# Patient Record
Sex: Female | Born: 1955 | Race: White | Hispanic: No | Marital: Married
Health system: Southern US, Community
[De-identification: ages and names within clinical notes are randomized; demographics above are authoritative.]

## PROBLEM LIST (undated history)

## (undated) DIAGNOSIS — F419 Anxiety disorder, unspecified: Secondary | ICD-10-CM

## (undated) DIAGNOSIS — K649 Unspecified hemorrhoids: Secondary | ICD-10-CM

## (undated) DIAGNOSIS — Z9851 Tubal ligation status: Secondary | ICD-10-CM

## (undated) DIAGNOSIS — F32A Depression, unspecified: Secondary | ICD-10-CM

---

## 1998-05-17 ENCOUNTER — Ambulatory Visit (HOSPITAL_COMMUNITY): Admission: RE | Admit: 1998-05-17 | Discharge: 1998-05-17 | Payer: Self-pay | Admitting: Obstetrics and Gynecology

## 2005-03-23 ENCOUNTER — Ambulatory Visit: Payer: Self-pay | Admitting: Internal Medicine

## 2005-03-23 ENCOUNTER — Ambulatory Visit: Payer: Self-pay | Admitting: Specialist

## 2005-09-10 ENCOUNTER — Ambulatory Visit: Payer: Self-pay | Admitting: Unknown Physician Specialty

## 2006-03-25 ENCOUNTER — Ambulatory Visit: Payer: Self-pay | Admitting: Internal Medicine

## 2007-12-04 ENCOUNTER — Ambulatory Visit: Payer: Self-pay | Admitting: Internal Medicine

## 2008-12-23 ENCOUNTER — Ambulatory Visit: Payer: Self-pay | Admitting: Internal Medicine

## 2010-08-02 ENCOUNTER — Emergency Department: Payer: Self-pay | Admitting: Emergency Medicine

## 2010-10-16 ENCOUNTER — Ambulatory Visit: Payer: Self-pay | Admitting: Internal Medicine

## 2010-10-17 ENCOUNTER — Ambulatory Visit: Payer: Self-pay | Admitting: Internal Medicine

## 2011-07-24 ENCOUNTER — Ambulatory Visit: Payer: Self-pay | Admitting: Internal Medicine

## 2011-07-25 ENCOUNTER — Ambulatory Visit: Payer: Self-pay | Admitting: Internal Medicine

## 2012-09-02 IMAGING — US ULTRASOUND RIGHT BREAST
1 series · 17 of 18 positions shown · non-contrast
Comparison: none

REASON FOR EXAM: AV RT NODULARITY
COMMENTS:

[Series 1: ultrasound right breast · 17 of 18 slices shown]
[im 1/18]
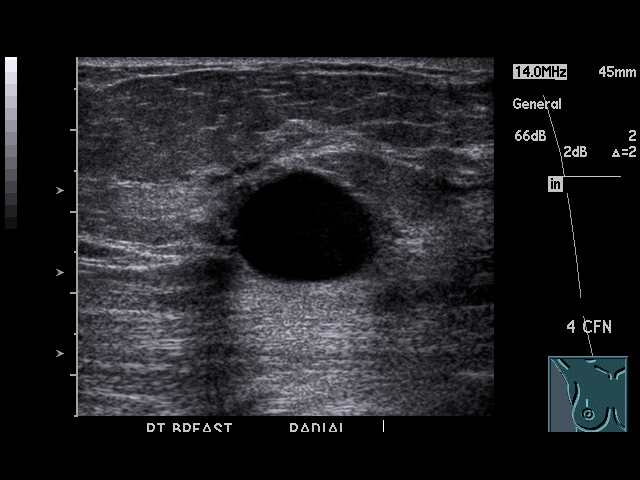
[im 2/18]
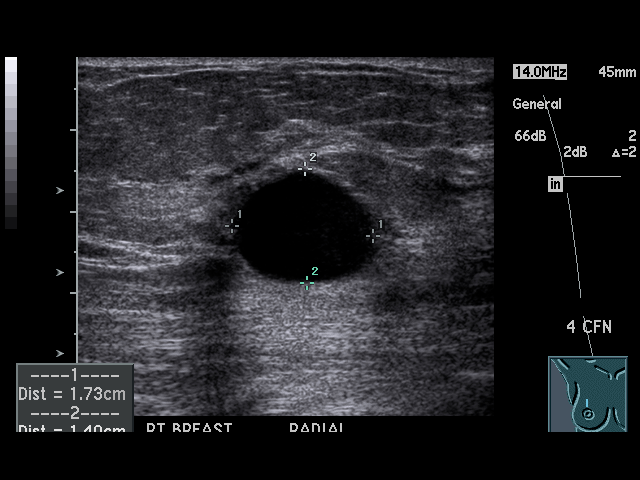
[im 3/18]
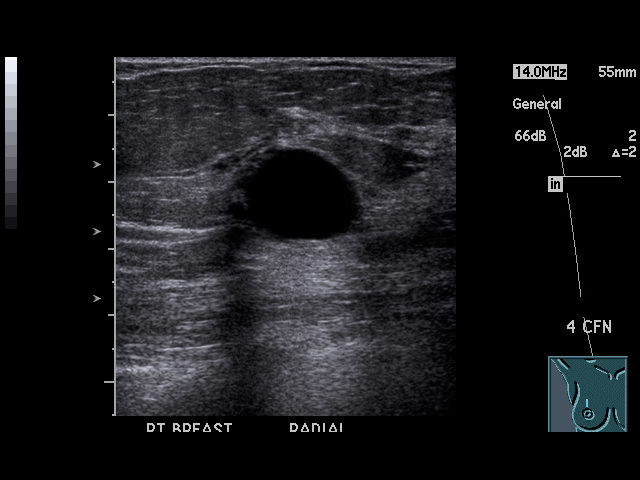
[im 4/18]
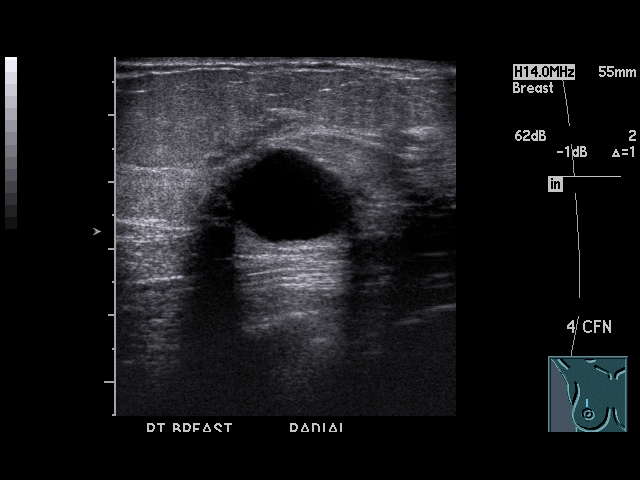
[im 5/18]
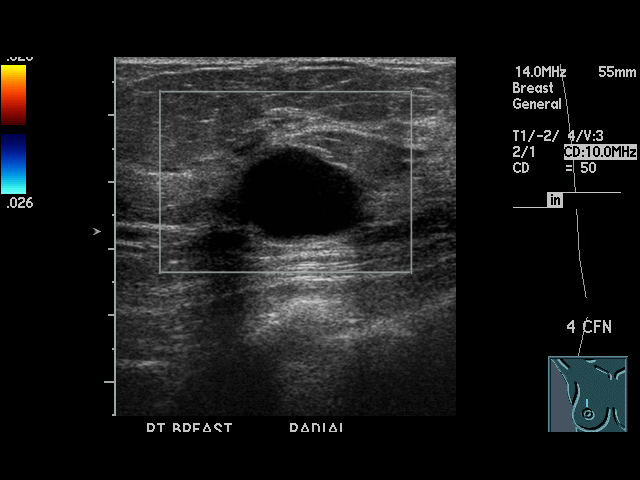
[im 6/18]
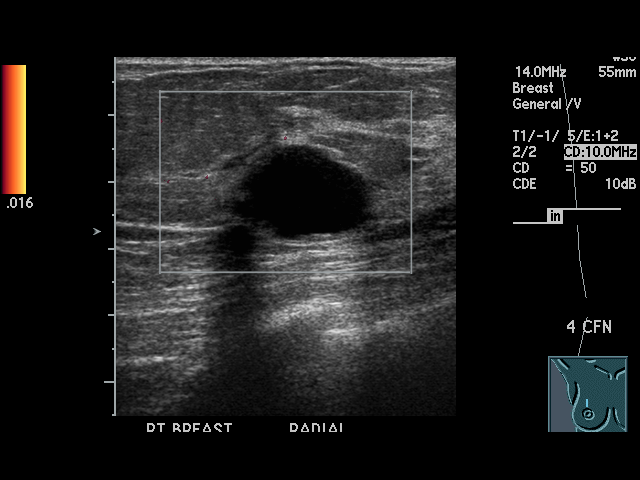
[im 7/18]
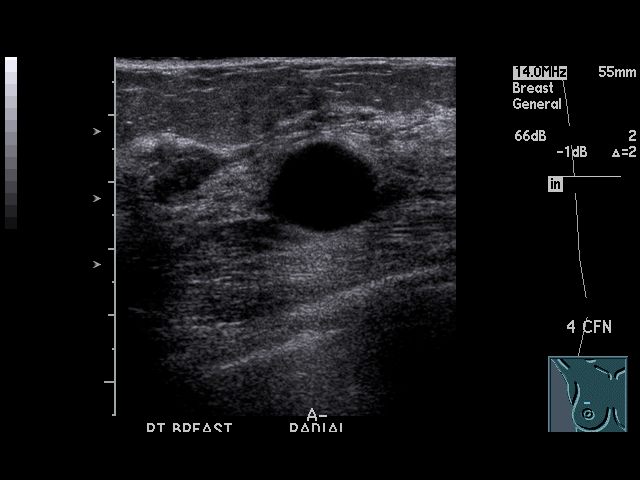
[im 8/18]
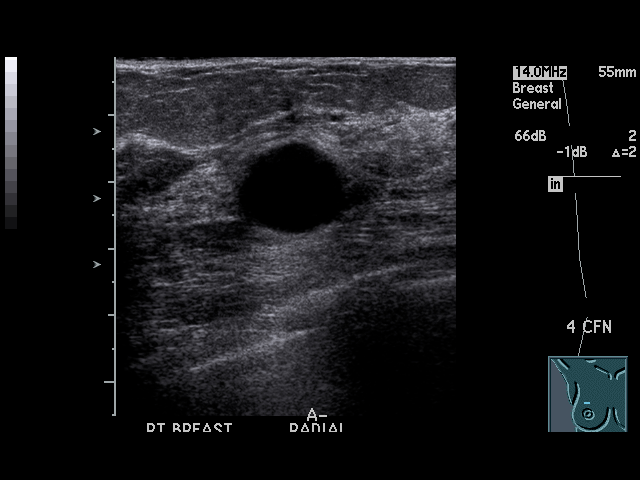
[im 10/18]
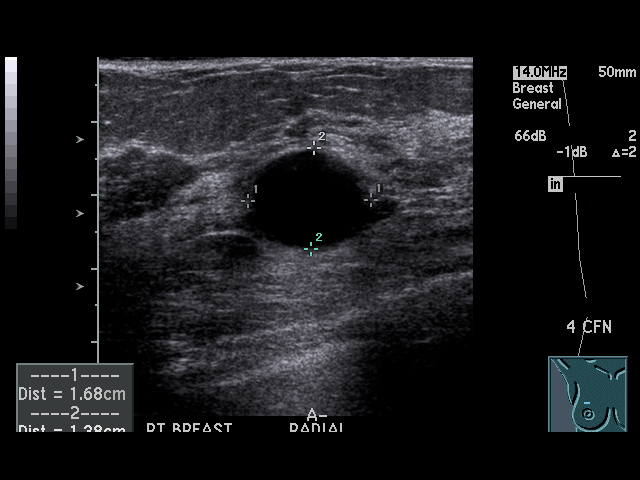
[im 11/18]
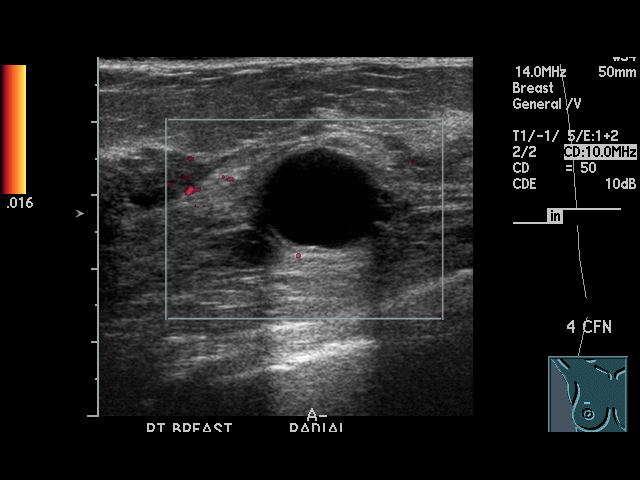
[im 12/18]
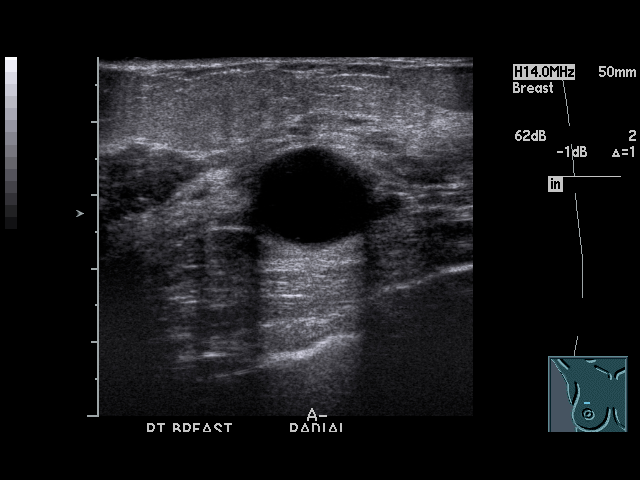
[im 13/18]
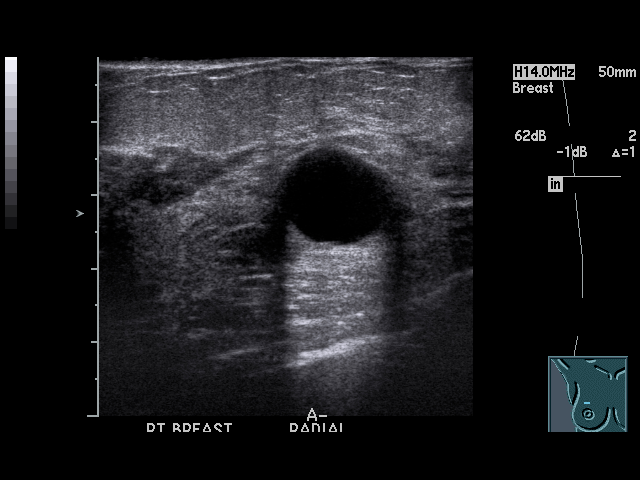
[im 14/18]
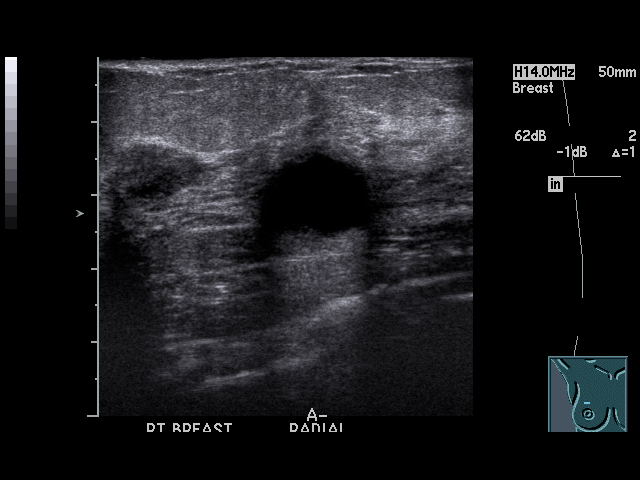
[im 15/18]
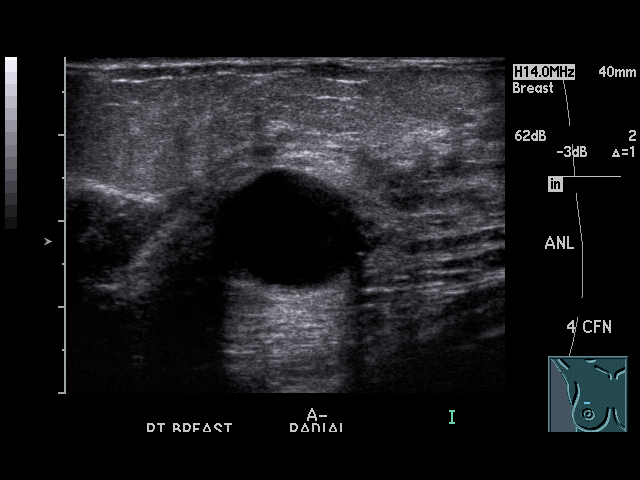
[im 16/18]
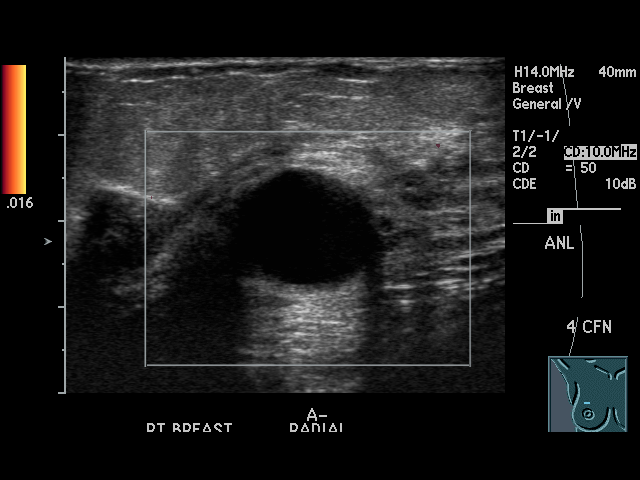
[im 17/18]
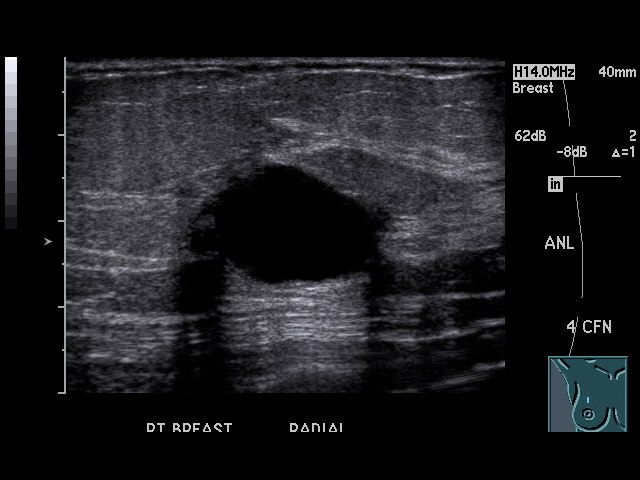
[im 18/18]
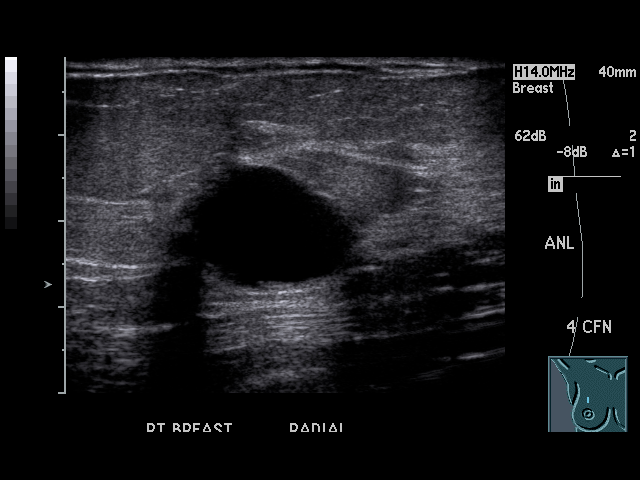

[17 of 18 positions shown; findings below may reference images not displayed]

PROCEDURE:     US  - US BREAST RIGHT  - July 25, 2011  [DATE]

RESULT:     The patient underwent directed ultrasound of the superior aspect
of the right breast in followup of a mammogram dated 24 July, 2011. On the
mammogram increased conspicuity of a smoothly rounded density was seen. On
today's ultrasound there is an anechoic structure measuring 1.7 x
centimeters at the [DATE] position demonstrating enhanced
through-transmission consistent with a cyst.
IMPRESSION: The dominant density seen mammographically is consistent
with a simple cyst.

BI-RADS 2

Recommendation: Please return to yearly mammographic followup of both
breasts.

A negative mammographic or ultrasonic report should not preclude biopsy of
clinically palpable or otherwise suspicious for lesions.

Dictation location: One

## 2012-11-20 ENCOUNTER — Ambulatory Visit: Payer: Self-pay | Admitting: Family Medicine

## 2014-11-10 ENCOUNTER — Other Ambulatory Visit: Payer: Self-pay | Admitting: Family Medicine

## 2014-11-10 DIAGNOSIS — Z1231 Encounter for screening mammogram for malignant neoplasm of breast: Secondary | ICD-10-CM

## 2014-11-11 ENCOUNTER — Encounter (INDEPENDENT_AMBULATORY_CARE_PROVIDER_SITE_OTHER): Payer: Self-pay

## 2014-11-11 ENCOUNTER — Ambulatory Visit: Payer: Self-pay

## 2014-11-11 ENCOUNTER — Ambulatory Visit
Admission: RE | Admit: 2014-11-11 | Discharge: 2014-11-11 | Disposition: A | Discharge status: Home / Self Care | Payer: BC Managed Care – PPO | Source: Ambulatory Visit | Attending: Family Medicine | Admitting: Family Medicine

## 2014-11-11 DIAGNOSIS — Z1231 Encounter for screening mammogram for malignant neoplasm of breast: Secondary | ICD-10-CM | POA: Diagnosis present

## 2016-03-15 ENCOUNTER — Ambulatory Visit (INDEPENDENT_AMBULATORY_CARE_PROVIDER_SITE_OTHER): Payer: Self-pay | Admitting: Orthopaedic Surgery

## 2016-06-25 ENCOUNTER — Other Ambulatory Visit: Payer: Self-pay | Admitting: Family Medicine

## 2016-09-10 ENCOUNTER — Other Ambulatory Visit: Payer: Self-pay | Admitting: Family Medicine

## 2016-09-10 DIAGNOSIS — Z1231 Encounter for screening mammogram for malignant neoplasm of breast: Secondary | ICD-10-CM

## 2016-09-11 ENCOUNTER — Inpatient Hospital Stay: Admission: RE | Admit: 2016-09-11 | Payer: BC Managed Care – PPO | Source: Ambulatory Visit

## 2021-03-04 LAB — COLOGUARD: COLOGUARD: NEGATIVE

## 2022-03-26 HISTORY — PX: JOINT REPLACEMENT: SHX530

## 2022-09-10 ENCOUNTER — Encounter: Payer: Self-pay | Admitting: Physical Therapy

## 2022-09-10 ENCOUNTER — Other Ambulatory Visit: Payer: Self-pay

## 2022-09-10 ENCOUNTER — Ambulatory Visit: Payer: Medicare PPO | Attending: Physician Assistant | Admitting: Physical Therapy

## 2022-09-10 DIAGNOSIS — Z96651 Presence of right artificial knee joint: Secondary | ICD-10-CM | POA: Diagnosis present

## 2022-09-10 DIAGNOSIS — M25561 Pain in right knee: Secondary | ICD-10-CM | POA: Insufficient documentation

## 2022-09-10 NOTE — Therapy (Signed)
OUTPATIENT PHYSICAL THERAPY LOWER EXTREMITY EVALUATION   Patient Name: Sally Cowan MRN: [id number] DOB:22-Oct-1955, 67 y.o., female Today's Date: 09/10/2022  END OF SESSION:  PT End of Session - 09/10/22 2131     Visit Number 1    Number of Visits 20    Date for PT Re-Evaluation 11/19/22    Authorization Type [employer] 2024    Authorization - Visit Number 1    Authorization - Number of Visits 20    Progress Note Due on Visit 10    PT Start Time 1600    PT Stop Time 1645    PT Time Calculation (min) 45 min    Activity Tolerance Patient tolerated treatment well    Behavior During Therapy [facility] for tasks assessed/performed             History reviewed. No pertinent past medical history. History reviewed. No pertinent surgical history. There are no problems to display for this patient.   PCP: No PCP   REFERRING PROVIDER: Addison Roe PA   REFERRING DIAG: Right TKA   THERAPY DIAG:  Acute pain of right knee  S/P TKR (total knee replacement), right  Rationale for Evaluation and Treatment: Rehabilitation  ONSET DATE: 09/06/22  SUBJECTIVE:   SUBJECTIVE STATEMENT: See pertinent history   PERTINENT HISTORY: Pt had right tka on June 13th and she reports everything has gone smoothly. She is currently living with her sister in law and she plans on living with her until she is able to drive again.  PAIN:  Are you having pain? Yes: NPRS scale: 6/10 Pain location: Anterior surface of left knee  Pain description: Achy Aggravating factors: Weight bearing and lifting right leg  Relieving factors: Meds   PRECAUTIONS: None  WEIGHT BEARING RESTRICTIONS: No  FALLS:  Has patient fallen in last 6 months? Yes. Number of falls Mechanical falls which she de describes as not picking up her feet   LIVING ENVIRONMENT: Lives with: lives with their family and Sister in Social worker  Lives in: House/apartment Stairs: Yes: External: 1 steps; none Has following equipment at home: Environmental consultant - 2  wheeled  OCCUPATION: She wants to return to gardening   PLOF: Independent  PATIENT GOALS: Return walking normally without pain   NEXT MD VISIT: 09/14/22  OBJECTIVE:   VITALS: BP 110/57 HR 64 Sp02 99  DIAGNOSTIC FINDINGS: None listed   PATIENT SURVEYS:  FOTO 30/100 with target of 55   COGNITION: Overall cognitive status: Within functional limits for tasks assessed     SENSATION: WFL  EDEMA:  Circumferential: R 18.5" L 16"  MUSCLE LENGTH: Not tested  Hamstrings: Right NA deg; Left NA deg Thomas test: Right  NA deg; Left NA deg  POSTURE: No Significant postural limitations  PALPATION: Not performed   LOWER EXTREMITY ROM:  Active/ Passive ROM Right eval Left eval  Hip flexion    Hip extension    Hip abduction    Hip adduction    Hip internal rotation    Hip external rotation    Knee flexion 60/65 135/135  Knee extension 3/5  0/0   Ankle dorsiflexion    Ankle plantarflexion    Ankle inversion    Ankle eversion     (Blank rows = not tested)        LOWER EXTREMITY MMT: Testing not performed   MMT Right eval Left eval  Hip flexion    Hip extension    Hip abduction    Hip adduction  Hip internal rotation    Hip external rotation    Knee flexion    Knee extension    Ankle dorsiflexion    Ankle plantarflexion    Ankle inversion    Ankle eversion     (Blank rows = not tested)   GAIT: Distance walked: 50 ft  Assistive device utilized: Walker - 2 wheeled Level of assistance: Modified independence Comments: Right sided antalgic gait    TODAY'S TREATMENT:                                                                                                                              DATE: All exercises performed on RLE   Quad Sets 3 sec hold 1 x 10    PATIENT EDUCATION:  Education details: form and technique to complete exercises correctly Person educated: Patient Education method: Programmer, multimedia, Demonstration, Verbal cues, and  Handouts Education comprehension: verbalized understanding, returned demonstration, and verbal cues required  HOME EXERCISE PROGRAM: Access Code: [email or web address] URL: [email or web address] Date: 09/10/2022 Prepared by: Ellin Goodie  Exercises - Supine Heel Slide  - 1 x daily - 3 sets - 10 reps - Supine Active Straight Leg Raise  - 1 x daily - 10 reps - Supine Quad Set  - 1 x daily - 3 sets - 10 reps - 3 sec  hold - Long Sitting Calf Stretch with Strap  - 1 x daily - 3 reps - 30-60 sec sec  hold  ASSESSMENT:  CLINICAL IMPRESSION: Patient is a 67 y.o. white who was seen today for physical therapy evaluation and treatment for right knee pain in setting of right TKA performed on June 13th. She is in phase II or the motion phase. She shows deficits that include decreased right knee ROM and strength and overall mobility and increased knee pain and swelling. She will benefit from skilled PT to address these aforementioned deficits to return to ambulating and bending and squatting so that she can resume gardening and walking to remain independent and active.   OBJECTIVE IMPAIRMENTS: Abnormal gait, decreased balance, decreased mobility, difficulty walking, decreased ROM, decreased strength, hypomobility, increased edema, impaired flexibility, and pain.   ACTIVITY LIMITATIONS: carrying, lifting, bending, sitting, standing, squatting, sleeping, stairs, bathing, toileting, and dressing  PARTICIPATION LIMITATIONS: cleaning, laundry, driving, shopping, community activity, yard work, and church  PERSONAL FACTORS: Age and Fitness are also affecting patient's functional outcome.   REHAB POTENTIAL: Good  CLINICAL DECISION MAKING: Stable/uncomplicated  EVALUATION COMPLEXITY: Low   GOALS: Goals reviewed with patient? No  SHORT TERM GOALS: Target date: 09/24/2022  Pt will be independent with HEP in order to improve strength and balance in order to decrease fall risk and improve function at  home and work. Baseline: NT  Goal status: INITIAL  2.  Patient will be able to ambulate without a 2WW as evidence of improve RLE function and mobility.  Baseline: 2WW Goal status: INITIAL   LONG TERM GOALS: Target date: 11/19/2022  Patient will have improved function and activity level as evidenced by an increase in FOTO score by 10 points or more.  Baseline: 30/100 with target of 55 Goal status: INITIAL  2.  Patient will show near symmetrical right knee AROM to left knee as evidence of improve right knee healing and function.  Baseline: Knee Flex R/L 60/135, Knee Ext R/L 3/0 Goal status: INITIAL  3.  Patient will show near symmetrical right strength as evidence of improved right knee healing and function.  Baseline: NT   Goal status: INITIAL  4.  Patient will be able to ambulate without an AD as evidence of improved right knee healing and function. Baseline: 2ww Goal status: INITIAL   PLAN:  PT FREQUENCY: 1-2x/week  PT DURATION: 10 weeks  PLANNED INTERVENTIONS: Therapeutic exercises, Neuromuscular re-education, Balance training, Gait training, Patient/Family education, Self Care, Joint mobilization, Joint manipulation, Stair training, DME instructions, Aquatic Therapy, Dry Needling, Electrical stimulation, Cryotherapy, Moist heat, Manual therapy, and Re-evaluation  PLAN FOR NEXT SESSION: Mini-squat, sit to stand, supine hip abduction, knee flexion AAROM in sitting.  Ellin Goodie PT, DPT  09/10/2022, 9:38 PM

## 2022-09-12 ENCOUNTER — Ambulatory Visit: Payer: Medicare PPO | Admitting: Physical Therapy

## 2022-09-12 DIAGNOSIS — M25561 Pain in right knee: Secondary | ICD-10-CM | POA: Diagnosis not present

## 2022-09-12 DIAGNOSIS — Z96651 Presence of right artificial knee joint: Secondary | ICD-10-CM

## 2022-09-12 NOTE — Therapy (Signed)
OUTPATIENT PHYSICAL THERAPY TREATMENT NOTE   Patient Name: Sally Cowan MRN: [id number] DOB:[birth year], 67 y.o., female Today's Date: 09/12/2022  PCP: Azell Der PA  REFERRING PROVIDER: Azell Der PA   END OF SESSION:   PT End of Session - 09/12/22 1304     Visit Number 2    Number of Visits 20    Date for PT Re-Evaluation 11/19/22    Authorization Type [employer] 2024    Authorization - Visit Number 2    Authorization - Number of Visits 20    Progress Note Due on Visit 10    PT Start Time 1305    PT Stop Time 1345    PT Time Calculation (min) 40 min    Activity Tolerance Patient tolerated treatment well    Behavior During Therapy [facility] for tasks assessed/performed             No past medical history on file. No past surgical history on file. There are no problems to display for this patient.   REFERRING DIAG: Right TKA   THERAPY DIAG:  No diagnosis found.  Rationale for Evaluation and Treatment Rehabilitation  PERTINENT HISTORY: Pt had right tka on June 13th and she reports everything has gone smoothly. She is currently living with her sister in law and she plans on living with her until she is able to drive again.   PRECAUTIONS: None  SUBJECTIVE:                                                                                                                                                                                      SUBJECTIVE STATEMENT:  Pt reports that she feels that her knee continues to feel better. She is    PAIN:  Are you having pain? Yes: NPRS scale: 3-4/10 Pain location: Right intra-articular knee  Pain description: Achy  Aggravating factors: Lifting leg up onto bed  Relieving factors: Keep knee still    OBJECTIVE: (objective measures completed at initial evaluation unless otherwise dated)  VITALS: BP 110/57 HR 64 Sp02 99   DIAGNOSTIC FINDINGS: None listed    PATIENT SURVEYS:  FOTO 30/100 with target of 55    COGNITION: Overall  cognitive status: Within functional limits for tasks assessed                         SENSATION: WFL   EDEMA:  Circumferential: R 18.5" L 16"   MUSCLE LENGTH: Not tested  Hamstrings: Right NA deg; Left NA deg Thomas test: Right  NA deg; Left NA deg   POSTURE: No Significant postural limitations   PALPATION: Not performed  LOWER EXTREMITY ROM:   Active/ Passive ROM Right eval Left eval  Hip flexion      Hip extension      Hip abduction      Hip adduction      Hip internal rotation      Hip external rotation      Knee flexion 60/65 135/135  Knee extension 3/5  0/0   Ankle dorsiflexion      Ankle plantarflexion      Ankle inversion      Ankle eversion       (Blank rows = not tested)              LOWER EXTREMITY MMT: Testing not performed    MMT Right eval Left eval  Hip flexion      Hip extension      Hip abduction      Hip adduction      Hip internal rotation      Hip external rotation      Knee flexion  4- 5   Knee extension 3-  5   Ankle dorsiflexion      Ankle plantarflexion      Ankle inversion      Ankle eversion       (Blank rows = not tested)     GAIT: Distance walked: 50 ft  Assistive device utilized: Walker - 2 wheeled Level of assistance: Modified independence Comments: Right sided antalgic gait      TODAY'S TREATMENT:                                                                                                                              DATE:   09/12/22: All exercises performed on RLE  Nu-Step with seat and arms at 8 for 5 min  Long Arc Quad 3 x 10  -Pt reports feeling muscle moving in lateral side of knee  AAROM Knee Flexion Slides 3 x 10  -placed slider under RLE foot  Short Arc Quads 3 x 10  Quad Sets 2 x 10 with 3 sec    09/10/22:  Quad Sets 3 sec hold 1 x 10      PATIENT EDUCATION:  Education details: form and technique to complete exercises correctly Person educated: Patient Education method: Programmer, multimedia,  Demonstration, Verbal cues, and Handouts Education comprehension: verbalized understanding, returned demonstration, and verbal cues required   HOME EXERCISE PROGRAM: Access Code: [email or web address] URL: [email or web address] Date: 09/12/2022 Prepared by: Ellin Goodie  Exercises - Long Sitting Calf Stretch with Strap  - 1 x daily - 3 reps - 30-60 sec sec  hold - Supine Knee Extension Strengthening  - 3-4 x weekly - 3 sets - 10 reps - Seated Long Arc Quad  - 3-4 x weekly - 3 sets - 10 reps - Supine Quad Set  - 1 x daily - 3 sets - 10 reps - 3 sec  hold - Seated Knee Flexion AAROM  -  1 x daily - 3 sets - 10 reps   ASSESSMENT:   CLINICAL IMPRESSION: Pt presents for initial treatment after eval. She was able to show continued quad activation throughout session with performance of short arc and long arc quads. She is not able to reach terminal knee extension, but this is to be expected given acuteness of surgery. She will benefit from skilled PT to address these aforementioned deficits to return to ambulating and bending and squatting so that she can resume gardening and walking to remain independent and active.   OBJECTIVE IMPAIRMENTS: Abnormal gait, decreased balance, decreased mobility, difficulty walking, decreased ROM, decreased strength, hypomobility, increased edema, impaired flexibility, and pain.    ACTIVITY LIMITATIONS: carrying, lifting, bending, sitting, standing, squatting, sleeping, stairs, bathing, toileting, and dressing   PARTICIPATION LIMITATIONS: cleaning, laundry, driving, shopping, community activity, yard work, and church   PERSONAL FACTORS: Age and Fitness are also affecting patient's functional outcome.    REHAB POTENTIAL: Good   CLINICAL DECISION MAKING: Stable/uncomplicated   EVALUATION COMPLEXITY: Low     GOALS: Goals reviewed with patient? No   SHORT TERM GOALS: Target date: 09/24/2022   Pt will be independent with HEP in order to improve strength  and balance in order to decrease fall risk and improve function at home and work. Baseline: Needs verbal cues to perform correctly  Goal status: Ongoing    2.  Patient will be able to ambulate without a 2WW as evidence of improve RLE function and mobility.  Baseline: 2WW Goal status: Ongoing      LONG TERM GOALS: Target date: 11/19/2022   Patient will have improved function and activity level as evidenced by an increase in FOTO score by 10 points or more.  Baseline: 30/100 with target of 55 Goal status: Ongoing    2.  Patient will show near symmetrical right knee AROM to left knee as evidence of improve right knee healing and function.  Baseline: Knee Flex R/L 60/135, Knee Ext R/L 3/0 Goal status: Ongoing    3.  Patient will show near symmetrical right strength as evidence of improved right knee healing and function.  Baseline: Knee Flex R/L 4-/5, Knee Ext R/L 3-/5 Goal status: Ongoing    4.  Patient will be able to ambulate without an AD as evidence of improved right knee healing and function. Baseline: 2ww Goal status: Ongoing      PLAN:   PT FREQUENCY: 1-2x/week   PT DURATION: 10 weeks   PLANNED INTERVENTIONS: Therapeutic exercises, Neuromuscular re-education, Balance training, Gait training, Patient/Family education, Self Care, Joint mobilization, Joint manipulation, Stair training, DME instructions, Aquatic Therapy, Dry Needling, Electrical stimulation, Cryotherapy, Moist heat, Manual therapy, and Re-evaluation   PLAN FOR NEXT SESSION: Begin to wean pt off of AD: Heel to toe.  NMES with short arc quads for improved quad activation. SLR in 4 lanes: Flexion/ Extension/Abduction/Adduction    Ellin Goodie PT, DPT  09/12/2022, 1:04 PM

## 2022-09-17 ENCOUNTER — Ambulatory Visit: Payer: Medicare PPO

## 2022-09-17 DIAGNOSIS — M25561 Pain in right knee: Secondary | ICD-10-CM

## 2022-09-17 DIAGNOSIS — Z96651 Presence of right artificial knee joint: Secondary | ICD-10-CM

## 2022-09-17 NOTE — Therapy (Signed)
OUTPATIENT PHYSICAL THERAPY TREATMENT    Patient Name: Sally Cowan MRN: [id number] DOB:06-22-1955, 67 y.o., female Today's Date: 09/17/2022  PCP: Azell Der PA  REFERRING PROVIDER: Azell Der PA   END OF SESSION:   PT End of Session - 09/17/22 1436     Visit Number 3    Number of Visits 20    Date for PT Re-Evaluation 11/19/22    Authorization Type [employer] 2024    Authorization - Visit Number 3    Authorization - Number of Visits 20    Progress Note Due on Visit 10    PT Start Time 1430    PT Stop Time 1510    PT Time Calculation (min) 40 min    Activity Tolerance Patient tolerated treatment well;No increased pain    Behavior During Therapy [facility] for tasks assessed/performed             No past medical history on file. No past surgical history on file. There are no problems to display for this patient.   REFERRING DIAG: Right TKA   THERAPY DIAG:  Acute pain of right knee  S/P TKR (total knee replacement), right  Rationale for Evaluation and Treatment Rehabilitation  PERTINENT HISTORY: Pt had right tka on June 13th and she reports everything has gone smoothly. She is currently living with her sister in law and she plans on living with her until she is able to drive again.   PRECAUTIONS: None  SUBJECTIVE:                                                                                                                                                                                      SUBJECTIVE STATEMENT:  Pt doing well in general. No updates since last visit.    PAIN:  Are you having pain? no   OBJECTIVE:    TODAY'S TREATMENT:                                                                                                                              DATE:   09/17/22:  -Nustep: seat 8, level  8, 4 minutes, level 1  -Education on use of RW for gait training, antalgic gait avoidance -standing knee flexion stretch on 2nd step  3x30sec -standing knee  extension stretch 2x30sec (might be better performed seated)  *education on HEP review; pt not performing calf stretch as she has no strap or belt to use as described (will defer to later date) -supine SAQ 1x10x5secH  -supine RLE Quadset into towel 10x5secH  -AMB overground c RW, cues for symmetry of gait: 514ft (0.35m/s or faster)  -AMB overground s RW, 2103ft (looks good, pain well control)  *advised use of RW for when pain causes limping or as distances are increased in future -STS x10, hands free, slight elevation of surface  -ROM in session: 15-85 degrees    ------------------------------------ 09/12/22: All exercises performed on RLE  Nu-Step with seat and arms at 8 for 5 min  Long Arc Quad 3 x 10  -Pt reports feeling muscle moving in lateral side of knee  AAROM Knee Flexion Slides 3 x 10  -placed slider under RLE foot  Short Arc Quads 3 x 10  Quad Sets 2 x 10 with 3 sec    09/10/22: Quad Sets 3 sec hold 1 x 10      PATIENT EDUCATION:  Education details: form and technique to complete exercises correctly Person educated: Patient Education method: Programmer, multimedia, Demonstration, Verbal cues, and Handouts Education comprehension: verbalized understanding, returned demonstration, and verbal cues required   HOME EXERCISE PROGRAM: Access Code: [email or web address] URL: [email or web address] Date: 09/12/2022 Prepared by: Ellin Goodie  Exercises - Long Sitting Calf Stretch with Strap  - 1 x daily - 3 reps - 30-60 sec sec  hold - Supine Knee Extension Strengthening  - 3-4 x weekly - 3 sets - 10 reps - Seated Long Arc Quad  - 3-4 x weekly - 3 sets - 10 reps - Supine Quad Set  - 1 x daily - 3 sets - 10 reps - 3 sec  hold - Seated Knee Flexion AAROM  - 1 x daily - 3 sets - 10 reps   ASSESSMENT:   CLINICAL IMPRESSION: Pt doing quite well, loading tolerance, ROM, edema all far beyond what is typical at this postop timeframe. Good tolerance of 511ft AMB c RW and 213ft subsequent  walking without device. She will benefit from skilled PT to address these aforementioned deficits to return to ambulating and bending and squatting so that she can resume gardening and walking to remain independent and active.   OBJECTIVE IMPAIRMENTS: Abnormal gait, decreased balance, decreased mobility, difficulty walking, decreased ROM, decreased strength, hypomobility, increased edema, impaired flexibility, and pain.    ACTIVITY LIMITATIONS: carrying, lifting, bending, sitting, standing, squatting, sleeping, stairs, bathing, toileting, and dressing   PARTICIPATION LIMITATIONS: cleaning, laundry, driving, shopping, community activity, yard work, and church   PERSONAL FACTORS: Age and Fitness are also affecting patient's functional outcome.    REHAB POTENTIAL: Good   CLINICAL DECISION MAKING: Stable/uncomplicated   EVALUATION COMPLEXITY: Low     GOALS: Goals reviewed with patient? No   SHORT TERM GOALS: Target date: 09/24/2022   Pt will be independent with HEP in order to improve strength and balance in order to decrease fall risk and improve function at home and work. Baseline: Needs verbal cues to perform correctly  Goal status: Ongoing    2.  Patient will be able to ambulate without a 2WW as evidence of improve RLE function and mobility.  Baseline: 2WW Goal status: Ongoing  LONG TERM GOALS: Target date: 11/19/2022   Patient will have improved function and activity level as evidenced by an increase in FOTO score by 10 points or more.  Baseline: 30/100 with target of 55 Goal status: Ongoing    2.  Patient will show near symmetrical right knee AROM to left knee as evidence of improve right knee healing and function.  Baseline: Knee Flex R/L 60/135, Knee Ext R/L 3/0 Goal status: Ongoing    3.  Patient will show near symmetrical right strength as evidence of improved right knee healing and function.  Baseline: Knee Flex R/L 4-/5, Knee Ext R/L 3-/5 Goal status: Ongoing     4.  Patient will be able to ambulate without an AD as evidence of improved right knee healing and function. Baseline: 2ww Goal status: Ongoing      PLAN:   PT FREQUENCY: 1-2x/week   PT DURATION: 10 weeks   PLANNED INTERVENTIONS: Therapeutic exercises, Neuromuscular re-education, Balance training, Gait training, Patient/Family education, Self Care, Joint mobilization, Joint manipulation, Stair training, DME instructions, Aquatic Therapy, Dry Needling, Electrical stimulation, Cryotherapy, Moist heat, Manual therapy, and Re-evaluation   PLAN FOR NEXT SESSION: Begin to wean pt off of AD: Heel to toe.  NMES with short arc quads for improved quad activation. SLR in 4 lanes: Flexion/ Extension/Abduction/Adduction   2:38 PM, 09/17/22 Rosamaria Lints, PT, DPT Physical Therapist - [place] [phone] (Office)  09/17/2022, 2:38 PM

## 2022-09-20 ENCOUNTER — Ambulatory Visit: Payer: Medicare PPO

## 2022-09-20 DIAGNOSIS — M25561 Pain in right knee: Secondary | ICD-10-CM | POA: Diagnosis not present

## 2022-09-20 DIAGNOSIS — Z96651 Presence of right artificial knee joint: Secondary | ICD-10-CM

## 2022-09-20 NOTE — Therapy (Signed)
OUTPATIENT PHYSICAL THERAPY TREATMENT    Patient Name: Sally Cowan MRN: [id number] DOB:February 10, 1956, 67 y.o., female Today's Date: 09/17/2022  PCP: Azell Der PA  REFERRING PROVIDER: Azell Der PA   END OF SESSION:   PT End of Session - 09/20/22 1306     Visit Number 4    Number of Visits 20    Date for PT Re-Evaluation 11/19/22    Authorization Type [employer] 2024    Authorization - Visit Number 4    Authorization - Number of Visits 20    Progress Note Due on Visit 10    PT Start Time 1301    PT Stop Time 1341    PT Time Calculation (min) 40 min    Equipment Utilized During Treatment Gait belt    Activity Tolerance Patient tolerated treatment well;No increased pain    Behavior During Therapy [facility] for tasks assessed/performed             No past medical history on file. No past surgical history on file. There are no problems to display for this patient.   REFERRING DIAG: Right TKA   THERAPY DIAG:  Acute pain of right knee  S/P TKR (total knee replacement), right  Rationale for Evaluation and Treatment Rehabilitation  PERTINENT HISTORY: Pt had right tka on June 13th and she reports everything has gone smoothly. She is currently living with her sister in law and she plans on living with her until she is able to drive again.   PRECAUTIONS: None  SUBJECTIVE:                                                                                                                                                                                      SUBJECTIVE STATEMENT:  Pt doing well in general. Tape continues to pull on skin a lot when she is stretching flexion at home. She has reduced use of DME for gait at home, still using RW when going out for longer walks.   PAIN:  Are you having pain? Yes (more soreness today 5-6/10)    OBJECTIVE:    TODAY'S TREATMENT:  DATE:    09/20/22 -Nustep: seat 8, level 8, 4 minutes, level 2  -SAQ over 9" bolster 1x12 x 3secH  -hooklying bridge 1x10, author sets feet symmetrical -hooklying RLE heel slides 1x15 c ext and flex stretches x1sec each way (15 total)  -RLE marching, cues to allow passive flexion 1x12  -SAQ over 9" bolster 1x12 x 3secH  -hooklying bridge 1x10, author sets feet symmetrical -hooklying RLE heel slides 1x15 c ext and flex stretches x1sec each way (15 total)  -RLE marching, cues to allow passive flexion 1x12  -ROM Assessment 09/20/22: Extension 21 degrees, flexion 112 degrees  -SAQ over 9" bolster 1x12 x 3secH  -Quadset into big popliteal towel roll 15x3secH   -Normal stance on foam with trunk rotation alterating sides x20 -semitandem stance (like heel toe on 2 side by sid ebalance beams) 1x30sec bilat  -narrow stance on foam with eyes closed 1x30sec  *recommended trimming some of dressing outer edge to decrease pain in skin tension ---------------------------------------------------------- 09/17/22:  -Nustep: seat 8, level 8, 4 minutes, level 2  -Education on use of RW for gait training, antalgic gait avoidance -standing knee flexion stretch on 2nd step  3x30sec -standing knee extension stretch 2x30sec (might be better performed seated)  *education on HEP review; pt not performing calf stretch as she has no strap or belt to use as described (will defer to later date) -supine SAQ 1x10x5secH  -supine RLE Quadset into towel 10x5secH  -AMB overground c RW, cues for symmetry of gait: 572ft (0.47m/s or faster)  -AMB overground s RW, 297ft (looks good, pain well control)  *advised use of RW for when pain causes limping or as distances are increased in future -STS x10, hands free, slight elevation of surface  -ROM in session: 15-85 degrees  ------------------------------------ 09/12/22: All exercises performed on RLE  Nu-Step with seat and arms at 8 for 5 min  Long Arc Quad 3 x 10   -Pt reports feeling muscle moving in lateral side of knee  AAROM Knee Flexion Slides 3 x 10  -placed slider under RLE foot  Short Arc Quads 3 x 10  Quad Sets 2 x 10 with 3 sec    09/10/22: Quad Sets 3 sec hold 1 x 10      PATIENT EDUCATION:  Education details: form and technique to complete exercises correctly Person educated: Patient Education method: Programmer, multimedia, Demonstration, Verbal cues, and Handouts Education comprehension: verbalized understanding, returned demonstration, and verbal cues required   HOME EXERCISE PROGRAM: Access Code: [email or web address] URL: [email or web address] Date: 09/12/2022 Prepared by: Ellin Goodie  Exercises - Long Sitting Calf Stretch with Strap  - 1 x daily - 3 reps - 30-60 sec sec  hold - Supine Knee Extension Strengthening  - 3-4 x weekly - 3 sets - 10 reps - Seated Long Arc Quad  - 3-4 x weekly - 3 sets - 10 reps - Supine Quad Set  - 1 x daily - 3 sets - 10 reps - 3 sec  hold - Seated Knee Flexion AAROM  - 1 x daily - 3 sets - 10 reps   ASSESSMENT:   CLINICAL IMPRESSION: Lower leg generally pink and blanching today under and near edges of large dressing. More sore today compared to previous date. Pt does well with supine exercises, good control of quads. Pain appears well controlled, at goal. Balance, proprioception, and gait are far beyond what it typical at this postoperative phase. Flexion ROM looks very good, but extension ROM needs attention. She will benefit  from skilled PT to address these aforementioned deficits to return to ambulating and bending and squatting so that she can resume gardening and walking to remain independent and active.   OBJECTIVE IMPAIRMENTS: Abnormal gait, decreased balance, decreased mobility, difficulty walking, decreased ROM, decreased strength, hypomobility, increased edema, impaired flexibility, and pain.    ACTIVITY LIMITATIONS: carrying, lifting, bending, sitting, standing, squatting, sleeping,  stairs, bathing, toileting, and dressing   PARTICIPATION LIMITATIONS: cleaning, laundry, driving, shopping, community activity, yard work, and church   PERSONAL FACTORS: Age and Fitness are also affecting patient's functional outcome.    REHAB POTENTIAL: Good   CLINICAL DECISION MAKING: Stable/uncomplicated   EVALUATION COMPLEXITY: Low     GOALS: Goals reviewed with patient? No   SHORT TERM GOALS: Target date: 09/24/2022   Pt will be independent with HEP in order to improve strength and balance in order to decrease fall risk and improve function at home and work. Baseline: Needs verbal cues to perform correctly  Goal status: Ongoing    2.  Patient will be able to ambulate without a 2WW as evidence of improve RLE function and mobility.  Baseline: 2WW Goal status: Ongoing      LONG TERM GOALS: Target date: 11/19/2022   Patient will have improved function and activity level as evidenced by an increase in FOTO score by 10 points or more.  Baseline: 30/100 with target of 55 Goal status: Ongoing    2.  Patient will show near symmetrical right knee AROM to left knee as evidence of improve right knee healing and function.  Baseline: Knee Flex R/L 60/135, Knee Ext R/L 3/0 Goal status: Ongoing    3.  Patient will show near symmetrical right strength as evidence of improved right knee healing and function.  Baseline: Knee Flex R/L 4-/5, Knee Ext R/L 3-/5 Goal status: Ongoing    4.  Patient will be able to ambulate without an AD as evidence of improved right knee healing and function. Baseline: 2ww Goal status: Achieved 6/27      PLAN:   PT FREQUENCY: 1-2x/week   PT DURATION: 10 weeks   PLANNED INTERVENTIONS: Therapeutic exercises, Neuromuscular re-education, Balance training, Gait training, Patient/Family education, Self Care, Joint mobilization, Joint manipulation, Stair training, DME instructions, Aquatic Therapy, Dry Needling, Electrical stimulation, Cryotherapy, Moist heat,  Manual therapy, and Re-evaluation   PLAN FOR NEXT SESSION: NMES with short arc quads for improved quad activation. SLR in 4 lanes: Flexion/ Extension/Abduction/Adduction   09/17/2022, 2:38 PM  1:07 PM, 09/20/22 Rosamaria Lints, PT, DPT Physical Therapist - [place] [phone] (Office)

## 2022-09-24 ENCOUNTER — Ambulatory Visit: Payer: Medicare PPO | Attending: Physician Assistant | Admitting: Physical Therapy

## 2022-09-24 DIAGNOSIS — Z96651 Presence of right artificial knee joint: Secondary | ICD-10-CM | POA: Diagnosis present

## 2022-09-24 DIAGNOSIS — M25561 Pain in right knee: Secondary | ICD-10-CM | POA: Diagnosis present

## 2022-09-24 NOTE — Therapy (Signed)
OUTPATIENT PHYSICAL THERAPY TREATMENT    Patient Name: Sally Cowan MRN: [id number] DOB:1956-01-13, 67 y.o., female Today's Date: 09/24/2022  PCP: Azell Der PA  REFERRING PROVIDER: Azell Der PA   END OF SESSION:   PT End of Session - 09/24/22 1648     Visit Number 5    Number of Visits 20    Date for PT Re-Evaluation 11/19/22    Authorization Type [employer] 2024    Authorization - Visit Number 5    Authorization - Number of Visits 20    Progress Note Due on Visit 10    PT Start Time 1648    PT Stop Time 1730    PT Time Calculation (min) 42 min    Equipment Utilized During Treatment Gait belt    Activity Tolerance Patient tolerated treatment well;No increased pain    Behavior During Therapy [facility] for tasks assessed/performed             No past medical history on file. No past surgical history on file. There are no problems to display for this patient.   REFERRING DIAG: Right TKA   THERAPY DIAG:  Acute pain of right knee  S/P TKR (total knee replacement), right  Rationale for Evaluation and Treatment Rehabilitation  PERTINENT HISTORY: Pt had right tka on June 13th and she reports everything has gone smoothly. She is currently living with her sister in law and she plans on living with her until she is able to drive again.   PRECAUTIONS: None  SUBJECTIVE:                                                                                                                                                                                      SUBJECTIVE STATEMENT: Pt reports knee pain in lateral side of knee especially when extending the knee.   PAIN:  Are you having pain? Yes (more soreness today 5-6/10)    OBJECTIVE:    TODAY'S TREATMENT:  DATE:   09/24/22: All single leg exercises performed on RLE  Nu-Step seat and arms 6 with resistance at 2  for 5 min  NMES with russian setting cycle time 5/5, mA CC 55  -LAQ 3 x 10  -SAQ with grey bolster under knee 3 x 10  -SLR with RLE flexed 3 x 10   R Knee Ext A/PROM 10/10  R knee Ext APROM 100/100  Seated HS Stretch 3 x 30 sec   09/20/22 -Nustep: seat 8, level 8, 4 minutes, level 2  -SAQ over 9" bolster 1x12 x 3secH  -hooklying bridge 1x10, author sets feet symmetrical -hooklying RLE heel slides 1x15 c ext and flex stretches x1sec each way (15 total)  -RLE marching, cues to allow passive flexion 1x12  -SAQ over 9" bolster 1x12 x 3secH  -hooklying bridge 1x10, author sets feet symmetrical -hooklying RLE heel slides 1x15 c ext and flex stretches x1sec each way (15 total)  -RLE marching, cues to allow passive flexion 1x12  -ROM Assessment 09/20/22: Extension 21 degrees, flexion 112 degrees  -SAQ over 9" bolster 1x12 x 3secH  -Quadset into big popliteal towel roll 15x3secH   -Normal stance on foam with trunk rotation alterating sides x20 -semitandem stance (like heel toe on 2 side by sid ebalance beams) 1x30sec bilat  -narrow stance on foam with eyes closed 1x30sec  *recommended trimming some of dressing outer edge to decrease pain in skin tension ---------------------------------------------------------- 09/17/22:  -Nustep: seat 8, level 8, 4 minutes, level 2  -Education on use of RW for gait training, antalgic gait avoidance -standing knee flexion stretch on 2nd step  3x30sec -standing knee extension stretch 2x30sec (might be better performed seated)  *education on HEP review; pt not performing calf stretch as she has no strap or belt to use as described (will defer to later date) -supine SAQ 1x10x5secH  -supine RLE Quadset into towel 10x5secH  -AMB overground c RW, cues for symmetry of gait: 534ft (0.77m/s or faster)  -AMB overground s RW, 224ft (looks good, pain well control)  *advised use of RW for when pain causes limping or as distances are increased in future -STS x10,  hands free, slight elevation of surface  -ROM in session: 15-85 degrees     PATIENT EDUCATION:  Education details: form and technique to complete exercises correctly Person educated: Patient Education method: Explanation, Demonstration, Verbal cues, and Handouts Education comprehension: verbalized understanding, returned demonstration, and verbal cues required   HOME EXERCISE PROGRAM: Access Code: [email or web address] URL: [email or web address] Date: 09/24/2022 Prepared by: Ellin Goodie  Exercises - Long Sitting Calf Stretch with Strap  - 1 x daily - 3 reps - 30-60 sec sec  hold - Seated Hamstring Stretch  - 1 x daily - 3 reps - 30-60 sec  hold - Supine Knee Extension Strengthening  - 3-4 x weekly - 3 sets - 10 reps - Seated Knee Flexion AAROM  - 1 x daily - 3 sets - 10 reps - Supine Active Straight Leg Raise  - 3-4 x weekly - 3 sets - 10 reps - Standing Hip Abduction with Counter Support  - 3-4 x weekly - 3 sets - 10 reps   ASSESSMENT:   CLINICAL IMPRESSION: Pt is now 3 weeks post op and she  shows improved right knee extension with use of NMES and pain reduction. She continues to have decreased knee ROM but it appears as if her right knee strength and muscular activation is improving. She will continue to benefit from skilled  PT to address these aforementioned deficits to return to ambulating and bending and squatting so that she can resume gardening and walking to remain independent and active.   OBJECTIVE IMPAIRMENTS: Abnormal gait, decreased balance, decreased mobility, difficulty walking, decreased ROM, decreased strength, hypomobility, increased edema, impaired flexibility, and pain.    ACTIVITY LIMITATIONS: carrying, lifting, bending, sitting, standing, squatting, sleeping, stairs, bathing, toileting, and dressing   PARTICIPATION LIMITATIONS: cleaning, laundry, driving, shopping, community activity, yard work, and church   PERSONAL FACTORS: Age and Fitness are also  affecting patient's functional outcome.    REHAB POTENTIAL: Good   CLINICAL DECISION MAKING: Stable/uncomplicated   EVALUATION COMPLEXITY: Low     GOALS: Goals reviewed with patient? No   SHORT TERM GOALS: Target date: 09/24/2022   Pt will be independent with HEP in order to improve strength and balance in order to decrease fall risk and improve function at home and work. Baseline: Needs verbal cues to perform correctly  Goal status: Ongoing    2.  Patient will be able to ambulate without a 2WW as evidence of improve RLE function and mobility.  Baseline: 2WW Goal status: Ongoing      LONG TERM GOALS: Target date: 11/19/2022   Patient will have improved function and activity level as evidenced by an increase in FOTO score by 10 points or more.  Baseline: 30/100 with target of 55 Goal status: Ongoing    2.  Patient will show near symmetrical right knee AROM to left knee as evidence of improve right knee healing and function.  Baseline: Knee Flex R/L 60/135, Knee Ext R/L 3/0 Goal status: Ongoing    3.  Patient will show near symmetrical right strength as evidence of improved right knee healing and function.  Baseline: Knee Flex R/L 4-/5, Knee Ext R/L 3-/5 Goal status: Ongoing    4.  Patient will be able to ambulate without an AD as evidence of improved right knee healing and function. Baseline: 2ww Goal status: Achieved 6/27      PLAN:   PT FREQUENCY: 1-2x/week   PT DURATION: 10 weeks   PLANNED INTERVENTIONS: Therapeutic exercises, Neuromuscular re-education, Balance training, Gait training, Patient/Family education, Self Care, Joint mobilization, Joint manipulation, Stair training, DME instructions, Aquatic Therapy, Dry Needling, Electrical stimulation, Cryotherapy, Moist heat, Manual therapy, and Re-evaluation   PLAN FOR NEXT SESSION: FOTO. Sit to stand and mini-squats. SLR in 4 lanes: Flexion/ Extension/Abduction/Adduction   Ellin Goodie PT, DPT

## 2022-09-25 ENCOUNTER — Ambulatory Visit: Payer: Medicare PPO | Admitting: Physical Therapy

## 2022-10-01 ENCOUNTER — Encounter: Payer: Self-pay | Admitting: Physical Therapy

## 2022-10-01 ENCOUNTER — Ambulatory Visit: Payer: Medicare PPO | Admitting: Physical Therapy

## 2022-10-01 DIAGNOSIS — Z96651 Presence of right artificial knee joint: Secondary | ICD-10-CM

## 2022-10-01 DIAGNOSIS — M25561 Pain in right knee: Secondary | ICD-10-CM | POA: Diagnosis not present

## 2022-10-01 NOTE — Therapy (Signed)
OUTPATIENT PHYSICAL THERAPY TREATMENT    Patient Name: Sally Cowan MRN: [id number] DOB:1955/07/31, 67 y.o., female Today's Date: 10/01/2022  PCP: Azell Der PA  REFERRING PROVIDER: Azell Der PA   END OF SESSION:   PT End of Session - 10/01/22 1220     Visit Number 6    Number of Visits 20    Date for PT Re-Evaluation 11/19/22    Authorization Type [employer] 2024    Authorization - Visit Number 6    Authorization - Number of Visits 20    Progress Note Due on Visit 10    PT Start Time 1215    PT Stop Time 1300    PT Time Calculation (min) 45 min    Equipment Utilized During Treatment Gait belt    Activity Tolerance Patient tolerated treatment well;No increased pain    Behavior During Therapy [facility] for tasks assessed/performed             No past medical history on file. History reviewed. No pertinent surgical history. There are no problems to display for this patient.   REFERRING DIAG: Right TKA   THERAPY DIAG:  Acute pain of right knee  S/P TKR (total knee replacement), right  Rationale for Evaluation and Treatment Rehabilitation  PERTINENT HISTORY: Pt had right tka on June 13th and she reports everything has gone smoothly. She is currently living with her sister in law and she plans on living with her until she is able to drive again.   PRECAUTIONS: None  SUBJECTIVE:                                                                                                                                                                                      SUBJECTIVE STATEMENT: Pt states that she feels as though she is regressing in that she is now feeling more pain. She saw orthopedist last week who removed the bandage from left knee.   PAIN:  Are you having pain? Yes (more soreness today 5-6/10) mostly when standing after a long sit    OBJECTIVE:    TODAY'S TREATMENT:  DATE:   10/01/22: All single leg exercises performed on RLE Nu-Step seat and arms 6 with resistance at 2 for 5 min  Edema: Circumference 16.5"/16" Education about only icing for only 15 to 20 min to avoid unwanted symptoms such as numbness and redness.   Sit to Stand with BUE support from 18 inch mat height 1 x 10  Sit to [facility] with 1 UE support from 18 inch mat height 2 x 10  -min VC to decrease speed of eccentric exercise  Standing Knee Flexion AROM on 6 inch step with BUE support 3 x 10  -min VC to maintain flat heel  FOTO: 59/100 with target of 55  09/24/22: All single leg exercises performed on RLE  Nu-Step seat and arms 6 with resistance at 2 for 5 min  NMES with russian setting cycle time 5/5, mA CC 55  -LAQ 3 x 10  -SAQ with grey bolster under knee 3 x 10  -SLR with RLE flexed 3 x 10   R Knee Ext A/PROM 10/10  R knee Ext APROM 100/100  Seated HS Stretch 3 x 30 sec   09/20/22 -Nustep: seat 8, level 8, 4 minutes, level 2  -SAQ over 9" bolster 1x12 x 3secH  -hooklying bridge 1x10, author sets feet symmetrical -hooklying RLE heel slides 1x15 c ext and flex stretches x1sec each way (15 total)  -RLE marching, cues to allow passive flexion 1x12  -SAQ over 9" bolster 1x12 x 3secH  -hooklying bridge 1x10, author sets feet symmetrical -hooklying RLE heel slides 1x15 c ext and flex stretches x1sec each way (15 total)  -RLE marching, cues to allow passive flexion 1x12  -ROM Assessment 09/20/22: Extension 21 degrees, flexion 112 degrees  -SAQ over 9" bolster 1x12 x 3secH  -Quadset into big popliteal towel roll 15x3secH   -Normal stance on foam with trunk rotation alterating sides x20 -semitandem stance (like heel toe on 2 side by sid ebalance beams) 1x30sec bilat  -narrow stance on foam with eyes closed 1x30sec  *recommended trimming some of dressing outer edge to decrease pain in skin  tension ---------------------------------------------------------- 09/17/22:  -Nustep: seat 8, level 8, 4 minutes, level 2  -Education on use of RW for gait training, antalgic gait avoidance -standing knee flexion stretch on 2nd step  3x30sec -standing knee extension stretch 2x30sec (might be better performed seated)  *education on HEP review; pt not performing calf stretch as she has no strap or belt to use as described (will defer to later date) -supine SAQ 1x10x5secH  -supine RLE Quadset into towel 10x5secH  -AMB overground c RW, cues for symmetry of gait: 544ft (0.27m/s or faster)  -AMB overground s RW, 271ft (looks good, pain well control)  *advised use of RW for when pain causes limping or as distances are increased in future -STS x10, hands free, slight elevation of surface  -ROM in session: 15-85 degrees     PATIENT EDUCATION:  Education details: form and technique to complete exercises correctly Person educated: Patient Education method: Explanation, Demonstration, Verbal cues, and Handouts Education comprehension: verbalized understanding, returned demonstration, and verbal cues required   HOME EXERCISE PROGRAM: Access Code: [email or web address] URL: [email or web address] Date: 09/24/2022 Prepared by: Ellin Goodie  Exercises - Long Sitting Calf Stretch with Strap  - 1 x daily - 3 reps - 30-60 sec sec  hold - Seated Hamstring Stretch  - 1 x daily - 3 reps - 30-60 sec  hold - Supine Knee Extension Strengthening  - 3-4 x weekly -  3 sets - 10 reps - Seated Knee Flexion AAROM  - 1 x daily - 3 sets - 10 reps - Supine Active Straight Leg Raise  - 3-4 x weekly - 3 sets - 10 reps - Standing Hip Abduction with Counter Support  - 3-4 x weekly - 3 sets - 10 reps   ASSESSMENT:   CLINICAL IMPRESSION: Pt is now 4 weeks s/p right tka. She shows decreased swelling in right knee and improved self perception of right knee function as evidenced by an increase in FOTO score. Pt able  to tolerate progression of strengthening and mobility exercises to standing exercises with mini-squats and standing knee flex stretch without an increase in her knee pain. She will continue to benefit from skilled PT to address these aforementioned deficits to return to ambulating and bending and squatting so that she can resume gardening and walking to remain independent and active.  OBJECTIVE IMPAIRMENTS: Abnormal gait, decreased balance, decreased mobility, difficulty walking, decreased ROM, decreased strength, hypomobility, increased edema, impaired flexibility, and pain.    ACTIVITY LIMITATIONS: carrying, lifting, bending, sitting, standing, squatting, sleeping, stairs, bathing, toileting, and dressing   PARTICIPATION LIMITATIONS: cleaning, laundry, driving, shopping, community activity, yard work, and church   PERSONAL FACTORS: Age and Fitness are also affecting patient's functional outcome.    REHAB POTENTIAL: Good   CLINICAL DECISION MAKING: Stable/uncomplicated   EVALUATION COMPLEXITY: Low     GOALS: Goals reviewed with patient? No   SHORT TERM GOALS: Target date: 09/24/2022   Pt will be independent with HEP in order to improve strength and balance in order to decrease fall risk and improve function at home and work. Baseline: Needs verbal cues to perform correctly  Goal status: Ongoing    2.  Patient will be able to ambulate without a 2WW as evidence of improve RLE function and mobility.  Baseline: 2WW Goal status: Ongoing      LONG TERM GOALS: Target date: 11/19/2022   Patient will have improved function and activity level as evidenced by an increase in FOTO score by 10 points or more.  Baseline: 30/100 with target of 55 Goal status: Ongoing    2.  Patient will show near symmetrical right knee AROM to left knee as evidence of improve right knee healing and function.  Baseline: Knee Flex R/L 60/135, Knee Ext R/L 3/0 Goal status: Ongoing    3.  Patient will show near  symmetrical right strength as evidence of improved right knee healing and function.  Baseline: Knee Flex R/L 4-/5, Knee Ext R/L 3-/5 Goal status: Ongoing    4.  Patient will be able to ambulate without an AD as evidence of improved right knee healing and function. Baseline: 2ww Goal status: Achieved 6/27      PLAN:   PT FREQUENCY: 1-2x/week   PT DURATION: 10 weeks   PLANNED INTERVENTIONS: Therapeutic exercises, Neuromuscular re-education, Balance training, Gait training, Patient/Family education, Self Care, Joint mobilization, Joint manipulation, Stair training, DME instructions, Aquatic Therapy, Dry Needling, Electrical stimulation, Cryotherapy, Moist heat, Manual therapy, and Re-evaluation   PLAN FOR NEXT SESSION: Introduce recumbent bicycle. SLR in 4 lanes: Flexion/ Extension/Abduction/Adduction.  Mini-lunges and TRX mini-squat.    Ellin Goodie PT, DPT

## 2022-10-04 ENCOUNTER — Ambulatory Visit: Payer: Medicare PPO | Admitting: Physical Therapy

## 2022-10-04 DIAGNOSIS — M25561 Pain in right knee: Secondary | ICD-10-CM

## 2022-10-04 DIAGNOSIS — Z96651 Presence of right artificial knee joint: Secondary | ICD-10-CM

## 2022-10-04 NOTE — Therapy (Signed)
OUTPATIENT PHYSICAL THERAPY TREATMENT    Patient Name: Sally Cowan MRN: [id number] DOB:29-Mar-1955, 67 y.o., female Today's Date: 10/04/2022  PCP: Azell Der PA  REFERRING PROVIDER: Azell Der PA   END OF SESSION:   PT End of Session - 10/04/22 1603     Visit Number 7    Number of Visits 20    Date for PT Re-Evaluation 11/19/22    Authorization Type [employer] [id number]    Authorization - Visit Number 7    Authorization - Number of Visits 20    Progress Note Due on Visit 10    PT Start Time 1520    PT Stop Time 1600    PT Time Calculation (min) 40 min    Equipment Utilized During Treatment Gait belt    Activity Tolerance Patient tolerated treatment well;No increased pain    Behavior During Therapy [facility] for tasks assessed/performed              No past medical history on file. No past surgical history on file. There are no problems to display for this patient.   REFERRING DIAG: Right TKA   THERAPY DIAG:  Acute pain of right knee  S/P TKR (total knee replacement), right  Rationale for Evaluation and Treatment Rehabilitation  PERTINENT HISTORY: Pt had right tka on June 13th and she reports everything has gone smoothly. She is currently living with her sister in law and she plans on living with her until she is able to drive again.   PRECAUTIONS: None  SUBJECTIVE:                                                                                                                                                                                      SUBJECTIVE STATEMENT: Pt reports that she has been doing well for the past two days and even feeling less pain.   PAIN:  Are you having pain? Yes (more soreness today 3-4/10) mostly when standing after a long sit    OBJECTIVE:    TODAY'S TREATMENT:  DATE:   10/04/22: All single leg exercises performed on  RLE Nu-Step seat and arms 6 with resistance at 2 for 7 min Explanation of EMR and how different EMRs cannot necessarily communicate. Mini-Squats with TRX 1 X 10  Mini-Squats with TRX with butt tap on 18 inch with foam bad 3 x 10  Mini-Squats with TRX with butt tap on 18 inch chair 3 x 10  Mini-Squats with BUE support on 20 inch mat height 2 x 10  Calf Raises 1 x 15  Calf Raises hold water jug 1 x 15   Discussion about transition of care to new clinic pending move to her home in [place]    10/01/22: All single leg exercises performed on RLE Nu-Step seat and arms 6 with resistance at 2 for 5 min  Edema: Circumference 16.5"/16" Education about only icing for only 15 to 20 min to avoid unwanted symptoms such as numbness and redness.   Sit to Stand with BUE support from 18 inch mat height 1 x 10  Sit to [facility] with 1 UE support from 18 inch mat height 2 x 10  -min VC to decrease speed of eccentric exercise  Standing Knee Flexion AROM on 6 inch step with BUE support 3 x 10  -min VC to maintain flat heel  FOTO: 59/100 with target of 55  09/24/22: All single leg exercises performed on RLE  Nu-Step seat and arms 6 with resistance at 2 for 5 min  NMES with russian setting cycle time 5/5, mA CC 55  -LAQ 3 x 10  -SAQ with grey bolster under knee 3 x 10  -SLR with RLE flexed 3 x 10   R Knee Ext A/PROM 10/10  R knee Ext APROM 100/100  Seated HS Stretch 3 x 30 sec   09/20/22 -Nustep: seat 8, level 8, 4 minutes, level 2  -SAQ over 9" bolster 1x12 x 3secH  -hooklying bridge 1x10, author sets feet symmetrical -hooklying RLE heel slides 1x15 c ext and flex stretches x1sec each way (15 total)  -RLE marching, cues to allow passive flexion 1x12  -SAQ over 9" bolster 1x12 x 3secH  -hooklying bridge 1x10, author sets feet symmetrical -hooklying RLE heel slides 1x15 c ext and flex stretches x1sec each way (15 total)  -RLE marching, cues to allow passive flexion 1x12  -ROM Assessment 09/20/22:  Extension 21 degrees, flexion 112 degrees  -SAQ over 9" bolster 1x12 x 3secH  -Quadset into big popliteal towel roll 15x3secH   -Normal stance on foam with trunk rotation alterating sides x20 -semitandem stance (like heel toe on 2 side by sid ebalance beams) 1x30sec bilat  -narrow stance on foam with eyes closed 1x30sec  *recommended trimming some of dressing outer edge to decrease pain in skin tension ---------------------------------------------------------- 09/17/22:  -Nustep: seat 8, level 8, 4 minutes, level 2  -Education on use of RW for gait training, antalgic gait avoidance -standing knee flexion stretch on 2nd step  3x30sec -standing knee extension stretch 2x30sec (might be better performed seated)  *education on HEP review; pt not performing calf stretch as she has no strap or belt to use as described (will defer to later date) -supine SAQ 1x10x5secH  -supine RLE Quadset into towel 10x5secH  -AMB overground c RW, cues for symmetry of gait: 539ft (0.68m/s or faster)  -AMB overground s RW, 284ft (looks good, pain well control)  *advised use of RW for when pain causes limping or as distances are increased in future -STS x10,  hands free, slight elevation of surface  -ROM in session: 15-85 degrees     PATIENT EDUCATION:  Education details: form and technique to complete exercises correctly Person educated: Patient Education method: Explanation, Demonstration, Verbal cues, and Handouts Education comprehension: verbalized understanding, returned demonstration, and verbal cues required   HOME EXERCISE PROGRAM:  Access Code: [email or web address] URL: [email or web address] Date: 10/04/2022 Prepared by: Ellin Goodie  Exercises - Long Sitting Calf Stretch with Strap  - 1 x daily - 3 reps - 30-60 sec sec  hold - Supine Active Straight Leg Raise  - 3-4 x weekly - 3 sets - 10 reps - Standing Hip Abduction with Counter Support  - 3-4 x weekly - 3 sets - 10 reps - Standing Knee  Flexion Stretch on Step  - 1 x daily - 10 reps - 2 sec  hold - Standing Heel Raise  - 3-4 x weekly - 3 sets - 15 reps - Mini Squat  - 3-4 x weekly - 3 sets - 10 reps - Seated Hamstring Stretch  - 1 x daily - 3 reps - 30-60 sec  hold   ASSESSMENT:   CLINICAL IMPRESSION: Pt is now 4 weeks s/p right tka. She continues to show improvement with right knee strength with ability to perform mini-squats with symmetrical weight bearing through RLE and LLE and little to no compensation. Pt is going to move back home next week and she is going to transfer her PT care to another clinic. Pt to continue to work on right knee strength and ROM and to provide pt with robust HEP by end of next week. She will continue to benefit from skilled PT to address these aforementioned deficits to return to ambulating and bending and squatting so that she can resume gardening and walking to remain independent and active.   OBJECTIVE IMPAIRMENTS: Abnormal gait, decreased balance, decreased mobility, difficulty walking, decreased ROM, decreased strength, hypomobility, increased edema, impaired flexibility, and pain.    ACTIVITY LIMITATIONS: carrying, lifting, bending, sitting, standing, squatting, sleeping, stairs, bathing, toileting, and dressing   PARTICIPATION LIMITATIONS: cleaning, laundry, driving, shopping, community activity, yard work, and church   PERSONAL FACTORS: Age and Fitness are also affecting patient's functional outcome.    REHAB POTENTIAL: Good   CLINICAL DECISION MAKING: Stable/uncomplicated   EVALUATION COMPLEXITY: Low     GOALS: Goals reviewed with patient? No   SHORT TERM GOALS: Target date: 09/24/2022   Pt will be independent with HEP in order to improve strength and balance in order to decrease fall risk and improve function at home and work. Baseline: Needs verbal cues to perform correctly  Goal status: Ongoing    2.  Patient will be able to ambulate without a 2WW as evidence of improve RLE  function and mobility.  Baseline: 2WW Goal status: Ongoing      LONG TERM GOALS: Target date: 11/19/2022   Patient will have improved function and activity level as evidenced by an increase in FOTO score by 10 points or more.  Baseline: 30/100 with target of 55 Goal status: Ongoing    2.  Patient will show near symmetrical right knee AROM to left knee as evidence of improve right knee healing and function.  Baseline: Knee Flex R/L 60/135, Knee Ext R/L 3/0 Goal status: Ongoing    3.  Patient will show near symmetrical right strength as evidence of improved right knee healing and function.  Baseline: Knee Flex R/L 4-/5, Knee Ext R/L 3-/5 Goal status: Ongoing  4.  Patient will be able to ambulate without an AD as evidence of improved right knee healing and function. Baseline: 2ww Goal status: Achieved 6/27      PLAN:   PT FREQUENCY: 1-2x/week   PT DURATION: 10 weeks   PLANNED INTERVENTIONS: Therapeutic exercises, Neuromuscular re-education, Balance training, Gait training, Patient/Family education, Self Care, Joint mobilization, Joint manipulation, Stair training, DME instructions, Aquatic Therapy, Dry Needling, Electrical stimulation, Cryotherapy, Moist heat, Manual therapy, and Re-evaluation   PLAN FOR NEXT SESSION: Introduce recumbent bicycle. SLR in 4 lanes: Flexion/ Extension/Abduction/Adduction.  Partial Lunges   Ellin Goodie PT, DPT

## 2022-10-08 ENCOUNTER — Encounter: Payer: Self-pay | Admitting: Physical Therapy

## 2022-10-08 ENCOUNTER — Ambulatory Visit: Payer: Medicare PPO | Admitting: Physical Therapy

## 2022-10-08 DIAGNOSIS — M25561 Pain in right knee: Secondary | ICD-10-CM | POA: Diagnosis not present

## 2022-10-08 DIAGNOSIS — Z96651 Presence of right artificial knee joint: Secondary | ICD-10-CM

## 2022-10-08 NOTE — Therapy (Signed)
OUTPATIENT PHYSICAL THERAPY TREATMENT    Patient Name: Sally Cowan MRN: [id number] DOB:Dec 19, 1955, 67 y.o., female Today's Date: 10/08/2022  PCP: Azell Der PA  REFERRING PROVIDER: Azell Der PA   END OF SESSION:   PT End of Session - 10/08/22 1307     Visit Number 8    Number of Visits 20    Date for PT Re-Evaluation 11/19/22    Authorization Type [employer] 2024    Authorization - Visit Number 8    Authorization - Number of Visits 20    Progress Note Due on Visit 10    PT Start Time 1305    PT Stop Time 1345    PT Time Calculation (min) 40 min    Equipment Utilized During Treatment Gait belt    Activity Tolerance Patient tolerated treatment well;No increased pain    Behavior During Therapy [facility] for tasks assessed/performed              History reviewed. No pertinent past medical history. History reviewed. No pertinent surgical history. There are no problems to display for this patient.   REFERRING DIAG: Right TKA   THERAPY DIAG:  Acute pain of right knee  S/P TKR (total knee replacement), right  Rationale for Evaluation and Treatment Rehabilitation  PERTINENT HISTORY: Pt had right tka on June 13th and she reports everything has gone smoothly. She is currently living with her sister in law and she plans on living with her until she is able to drive again.   PRECAUTIONS: None  SUBJECTIVE:                                                                                                                                                                                      SUBJECTIVE STATEMENT: Pt states that she was concerned that she might have DVT with numbness going down foot. She did not notice increased warmness or tenderness in the calf and she has not history of past DVTs.   PAIN:  Are you having pain? Yes (more soreness today 3-4/10) mostly when standing after a long sit    OBJECTIVE:    TODAY'S TREATMENT:  DATE:   10/08/22: All single leg exercises performed on RLE Matrix recumbent with bike set at 11 and resistance at 2 for 5 min  Step Up on 6 inch with use of BUE support using railings 1 x 10  Step Up on 6 inch with no UE support 2 x 10  TRX Mini-Squats 2 x 10  -Pt reports increased pain in the anterior surface of knee around scar Scar Tissue Massage Education  Right Knee Extensor Lag  Seated Knee Extension with #10 lb weight in backpack  Prone Knee Extension with #10 AW 2 min hang  -min VC to decrease knee extension     10/04/22: All single leg exercises performed on RLE Nu-Step seat and arms 6 with resistance at 2 for 7 min Explanation of EMR and how different EMRs cannot necessarily communicate. Mini-Squats with TRX 1 X 10  Mini-Squats with TRX with butt tap on 18 inch with foam bad 3 x 10  Mini-Squats with TRX with butt tap on 18 inch chair 3 x 10  Mini-Squats with BUE support on 20 inch mat height 2 x 10  Calf Raises 1 x 15  Calf Raises hold water jug 1 x 15   Discussion about transition of care to new clinic pending move to her home in [place]    10/01/22: All single leg exercises performed on RLE Nu-Step seat and arms 6 with resistance at 2 for 5 min  Edema: Circumference 16.5"/16" Education about only icing for only 15 to 20 min to avoid unwanted symptoms such as numbness and redness.   Sit to Stand with BUE support from 18 inch mat height 1 x 10  Sit to [facility] with 1 UE support from 18 inch mat height 2 x 10  -min VC to decrease speed of eccentric exercise  Standing Knee Flexion AROM on 6 inch step with BUE support 3 x 10  -min VC to maintain flat heel  FOTO: 59/100 with target of 55  09/24/22: All single leg exercises performed on RLE  Nu-Step seat and arms 6 with resistance at 2 for 5 min  NMES with russian setting cycle time 5/5, mA CC 55  -LAQ 3 x 10  -SAQ with grey bolster under knee 3 x  10  -SLR with RLE flexed 3 x 10   R Knee Ext A/PROM 10/10  R knee Ext APROM 100/100  Seated HS Stretch 3 x 30 sec   09/20/22 -Nustep: seat 8, level 8, 4 minutes, level 2  -SAQ over 9" bolster 1x12 x 3secH  -hooklying bridge 1x10, author sets feet symmetrical -hooklying RLE heel slides 1x15 c ext and flex stretches x1sec each way (15 total)  -RLE marching, cues to allow passive flexion 1x12  -SAQ over 9" bolster 1x12 x 3secH  -hooklying bridge 1x10, author sets feet symmetrical -hooklying RLE heel slides 1x15 c ext and flex stretches x1sec each way (15 total)  -RLE marching, cues to allow passive flexion 1x12  -ROM Assessment 09/20/22: Extension 21 degrees, flexion 112 degrees  -SAQ over 9" bolster 1x12 x 3secH  -Quadset into big popliteal towel roll 15x3secH   -Normal stance on foam with trunk rotation alterating sides x20 -semitandem stance (like heel toe on 2 side by sid ebalance beams) 1x30sec bilat  -narrow stance on foam with eyes closed 1x30sec  *recommended trimming some of dressing outer edge to decrease pain in skin tension ---------------------------------------------------------- 09/17/22:  -Nustep: seat 8, level 8, 4 minutes, level 2  -Education  on use of RW for gait training, antalgic gait avoidance -standing knee flexion stretch on 2nd step  3x30sec -standing knee extension stretch 2x30sec (might be better performed seated)  *education on HEP review; pt not performing calf stretch as she has no strap or belt to use as described (will defer to later date) -supine SAQ 1x10x5secH  -supine RLE Quadset into towel 10x5secH  -AMB overground c RW, cues for symmetry of gait: 555ft (0.12m/s or faster)  -AMB overground s RW, 269ft (looks good, pain well control)  *advised use of RW for when pain causes limping or as distances are increased in future -STS x10, hands free, slight elevation of surface  -ROM in session: 15-85 degrees     PATIENT EDUCATION:  Education  details: form and technique to complete exercises correctly Person educated: Patient Education method: Explanation, Demonstration, Verbal cues, and Handouts Education comprehension: verbalized understanding, returned demonstration, and verbal cues required   HOME EXERCISE PROGRAM: Access Code: [email or web address] URL: [email or web address] Date: 10/08/2022 Prepared by: Ellin Goodie  Exercises - Long Sitting Calf Stretch with Strap  - 1 x daily - 3 reps - 30-60 sec sec  hold - Supine Active Straight Leg Raise  - 3-4 x weekly - 3 sets - 10 reps - Standing Hip Abduction with Counter Support  - 3-4 x weekly - 3 sets - 10 reps - Standing Knee Flexion Stretch on Step  - 1 x daily - 10 reps - 2 sec  hold - Standing Heel Raise  - 3-4 x weekly - 3 sets - 15 reps - Mini Squat  - 3-4 x weekly - 3 sets - 10 reps - Seated Hamstring Stretch  - 1 x daily - 3 reps - 30-60 sec  hold - Prone Knee Extension with Ankle Weight  - 1 x daily - 2-3 reps - 10 min hold  Patient Education - Scar Massage   ASSESSMENT:   CLINICAL IMPRESSION: Pt is now 5 weeks s/p right tka. She shows improved knee strength with ability to perform step ups with no UE support and improved knee flexion mobilty by completing full rotations on matrix recumbent bike for warm up. However, pt does show decreased right knee extension with ongoing extensor lag. Focused on therex for improved right knee extension with prone knee extension, which patient was able to tolerate well. She will continue to benefit from skilled PT to address these aforementioned deficits to return to ambulating and bending and squatting so that she can resume gardening and walking to remain independent and active.   OBJECTIVE IMPAIRMENTS: Abnormal gait, decreased balance, decreased mobility, difficulty walking, decreased ROM, decreased strength, hypomobility, increased edema, impaired flexibility, and pain.    ACTIVITY LIMITATIONS: carrying, lifting, bending,  sitting, standing, squatting, sleeping, stairs, bathing, toileting, and dressing   PARTICIPATION LIMITATIONS: cleaning, laundry, driving, shopping, community activity, yard work, and church   PERSONAL FACTORS: Age and Fitness are also affecting patient's functional outcome.    REHAB POTENTIAL: Good   CLINICAL DECISION MAKING: Stable/uncomplicated   EVALUATION COMPLEXITY: Low     GOALS: Goals reviewed with patient? No   SHORT TERM GOALS: Target date: 09/24/2022   Pt will be independent with HEP in order to improve strength and balance in order to decrease fall risk and improve function at home and work. Baseline: Needs verbal cues to perform correctly  Goal status: Ongoing    2.  Patient will be able to ambulate without a 2WW as evidence of improve RLE function and mobility.  Baseline: 2WW Goal status: Ongoing      LONG TERM GOALS: Target date: 11/19/2022   Patient will have improved function and activity level as evidenced by an increase in FOTO score by 10 points or more.  Baseline: 30/100 with target of 55 Goal status: Ongoing    2.  Patient will show near symmetrical right knee AROM to left knee as evidence of improve right knee healing and function.  Baseline: Knee Flex R/L 60/135, Knee Ext R/L 3/0 Goal status: Ongoing    3.  Patient will show near symmetrical right strength as evidence of improved right knee healing and function.  Baseline: Knee Flex R/L 4-/5, Knee Ext R/L 3-/5 Goal status: Ongoing    4.  Patient will be able to ambulate without an AD as evidence of improved right knee healing and function. Baseline: 2ww Goal status: Achieved 6/27      PLAN:   PT FREQUENCY: 1-2x/week   PT DURATION: 10 weeks   PLANNED INTERVENTIONS: Therapeutic exercises, Neuromuscular re-education, Balance training, Gait training, Patient/Family education, Self Care, Joint mobilization, Joint manipulation, Stair training, DME instructions, Aquatic Therapy, Dry Needling,  Electrical stimulation, Cryotherapy, Moist heat, Manual therapy, and Re-evaluation   PLAN FOR NEXT SESSION: Introduce recumbent bicycle. TKE. Partial Lunges and check in on knee hang from There[facility]   Ellin Goodie PT, DPT

## 2022-10-09 ENCOUNTER — Ambulatory Visit: Payer: Medicare PPO | Admitting: Physical Therapy

## 2022-10-11 ENCOUNTER — Ambulatory Visit: Payer: Medicare PPO | Admitting: Physical Therapy

## 2022-10-11 DIAGNOSIS — Z96651 Presence of right artificial knee joint: Secondary | ICD-10-CM

## 2022-10-11 DIAGNOSIS — M25561 Pain in right knee: Secondary | ICD-10-CM

## 2022-10-11 NOTE — Therapy (Addendum)
OUTPATIENT PHYSICAL THERAPY TREATMENT    Patient Name: Sally Cowan MRN: [id number] DOB:12-Jan-1956, 67 y.o., female Today's Date: 10/11/2022  PCP: Azell Der PA  REFERRING PROVIDER: Azell Der PA   END OF SESSION:   PT End of Session - 10/11/22 1106     Visit Number 9    Number of Visits 20    Date for PT Re-Evaluation 11/19/22    Authorization Type [employer] 2024    Authorization - Visit Number 9    Authorization - Number of Visits 20    Progress Note Due on Visit 10    PT Start Time 1105    PT Stop Time 1145    PT Time Calculation (min) 40 min    Equipment Utilized During Treatment Gait belt    Activity Tolerance Patient tolerated treatment well;No increased pain    Behavior During Therapy [facility] for tasks assessed/performed              No past medical history on file. No past surgical history on file. There are no problems to display for this patient.   REFERRING DIAG: Right TKA   THERAPY DIAG:  Acute pain of right knee  S/P TKR (total knee replacement), right  Rationale for Evaluation and Treatment Rehabilitation  PERTINENT HISTORY: Pt had right tka on June 13th and she reports everything has gone smoothly. She is currently living with her sister in law and she plans on living with her until she is able to drive again.   PRECAUTIONS: None  SUBJECTIVE:                                                                                                                                                                                      SUBJECTIVE STATEMENT: Pt reports improvement in her right knee pain and swelling. She reports having difficulty when she initially gets out of car.   PAIN:  Are you having pain? Yes (more soreness today 2/10) mostly when standing after a long sit    OBJECTIVE:    TODAY'S TREATMENT:  DATE:   10/11/22: All  single leg exercises performed on RLE Matrix recumbent with bike set at 9 and resistance at 3 for 2 min  Matrix recumbent with bike set at 9 and resistance at 5 for 3 min  OMEGA Leg Press #55 with seat at 1- 1 x 10  OMEGA Leg Press #75 with seat at 1- 2 x 10  Terminal Knee Ext with Blue Band 1 x 10  -Pt unable to achieve full extension  Terminal Knee Ext with Ball at [facility] with 3 sec hold  2 x 10   Partial Lunges with 1 UE support 1 x 10  -min VC to increase base of support  Partial Lunges with no UE support 2 x 10  OMEGA Knee Ext with #15 3 x 10  -min VC to decrease speed of eccentric portion of exercise     10/08/22: All single leg exercises performed on RLE Matrix recumbent with bike set at 11 and resistance at 2 for 5 min  Step Up on 6 inch with use of BUE support using railings 1 x 10  Step Up on 6 inch with no UE support 2 x 10  TRX Mini-Squats 2 x 10  -Pt reports increased pain in the anterior surface of knee around scar Scar Tissue Massage Education  Right Knee Extensor Lag  Seated Knee Extension with #10 lb weight in backpack  Prone Knee Extension with #10 AW 2 min hang  -min VC to decrease knee extension   10/04/22: All single leg exercises performed on RLE Nu-Step seat and arms 6 with resistance at 2 for 7 min Explanation of EMR and how different EMRs cannot necessarily communicate. Mini-Squats with TRX 1 X 10  Mini-Squats with TRX with butt tap on 18 inch with foam bad 3 x 10  Mini-Squats with TRX with butt tap on 18 inch chair 3 x 10  Mini-Squats with BUE support on 20 inch mat height 2 x 10  Calf Raises 1 x 15  Calf Raises hold water jug 1 x 15   Discussion about transition of care to new clinic pending move to her home in [place]    10/01/22: All single leg exercises performed on RLE Nu-Step seat and arms 6 with resistance at 2 for 5 min  Edema: Circumference 16.5"/16" Education about only icing for only 15 to 20 min to avoid unwanted symptoms such as  numbness and redness.   Sit to Stand with BUE support from 18 inch mat height 1 x 10  Sit to [facility] with 1 UE support from 18 inch mat height 2 x 10  -min VC to decrease speed of eccentric exercise  Standing Knee Flexion AROM on 6 inch step with BUE support 3 x 10  -min VC to maintain flat heel  FOTO: 59/100 with target of 55  09/24/22: All single leg exercises performed on RLE  Nu-Step seat and arms 6 with resistance at 2 for 5 min  NMES with russian setting cycle time 5/5, mA CC 55  -LAQ 3 x 10  -SAQ with grey bolster under knee 3 x 10  -SLR with RLE flexed 3 x 10   R Knee Ext A/PROM 10/10  R knee Ext APROM 100/100  Seated HS Stretch 3 x 30 sec   09/20/22 -Nustep: seat 8, level 8, 4 minutes, level 2  -SAQ over 9" bolster 1x12 x 3secH  -hooklying bridge 1x10, author sets feet symmetrical -hooklying RLE heel slides 1x15 c ext  and flex stretches x1sec each way (15 total)  -RLE marching, cues to allow passive flexion 1x12  -SAQ over 9" bolster 1x12 x 3secH  -hooklying bridge 1x10, author sets feet symmetrical -hooklying RLE heel slides 1x15 c ext and flex stretches x1sec each way (15 total)  -RLE marching, cues to allow passive flexion 1x12  -ROM Assessment 09/20/22: Extension 21 degrees, flexion 112 degrees  -SAQ over 9" bolster 1x12 x 3secH  -Quadset into big popliteal towel roll 15x3secH   -Normal stance on foam with trunk rotation alterating sides x20 -semitandem stance (like heel toe on 2 side by sid ebalance beams) 1x30sec bilat  -narrow stance on foam with eyes closed 1x30sec  *recommended trimming some of dressing outer edge to decrease pain in skin tension  PATIENT EDUCATION:  Education details: form and technique to complete exercises correctly Person educated: Patient Education method: Programmer, multimedia, Demonstration, Verbal cues, and Handouts Education comprehension: verbalized understanding, returned demonstration, and verbal cues required   HOME EXERCISE  PROGRAM: Access Code: [email or web address] URL: [email or web address] Date: 10/11/2022 Prepared by: Ellin Goodie  Exercises - Long Sitting Calf Stretch with Strap  - 1 x daily - 3 reps - 30-60 sec sec  hold - Seated Hamstring Stretch  - 1 x daily - 3 reps - 30-60 sec  hold - Standing Hip Abduction with Counter Support  - 3-4 x weekly - 3 sets - 10 reps - Standing Terminal Knee Extension at Wall with Ball  - 3-4 x weekly - 3 sets - 10 reps - 3 sec hold - Standing Partial Lunge  - 3-4 x weekly - 3 sets - 10 reps - Mini Squat  - 3-4 x weekly - 3 sets - 10 reps - Prone Knee Extension with Ankle Weight  - 1 x daily - 2-3 reps - 10 min hold - Seated Knee Extension Stretch with Chair  - 1 x daily - 2-3 reps - 5-10 min hold  Patient Education - Scar Massage   ASSESSMENT:   CLINICAL IMPRESSION: Pt is now 5.5 weeks s/p right tka. She continues to make progress with left knee quad activation and left knee strengthening with ability to perform partial squats and terminal knee extension isometrics. She was limited in performing TKE with increased resistance due to decreased quad strength and knee extension AROM. Pt has elected to stay in this area and she will continue her care with current clinic. She will continue to benefit from skilled PT to address these aforementioned deficits to return to ambulating and bending and squatting so that she can resume gardening and walking to remain independent and active.  OBJECTIVE IMPAIRMENTS: Abnormal gait, decreased balance, decreased mobility, difficulty walking, decreased ROM, decreased strength, hypomobility, increased edema, impaired flexibility, and pain.    ACTIVITY LIMITATIONS: carrying, lifting, bending, sitting, standing, squatting, sleeping, stairs, bathing, toileting, and dressing   PARTICIPATION LIMITATIONS: cleaning, laundry, driving, shopping, community activity, yard work, and church   PERSONAL FACTORS: Age and Fitness are also affecting  patient's functional outcome.    REHAB POTENTIAL: Good   CLINICAL DECISION MAKING: Stable/uncomplicated   EVALUATION COMPLEXITY: Low     GOALS: Goals reviewed with patient? No   SHORT TERM GOALS: Target date: 09/24/2022   Pt will be independent with HEP in order to improve strength and balance in order to decrease fall risk and improve function at home and work. Baseline: Needs verbal cues to perform correctly  Goal status: Ongoing    2.  Patient will be able to ambulate without  a 2WW as evidence of improve RLE function and mobility.  Baseline: 2WW Goal status: Ongoing      LONG TERM GOALS: Target date: 11/19/2022   Patient will have improved function and activity level as evidenced by an increase in FOTO score by 10 points or more.  Baseline: 30/100 with target of 55 Goal status: Ongoing    2.  Patient will show near symmetrical right knee AROM to left knee as evidence of improve right knee healing and function.  Baseline: Knee Flex R/L 60/135, Knee Ext R/L 3/0 Goal status: Ongoing    3.  Patient will show near symmetrical right strength as evidence of improved right knee healing and function.  Baseline: Knee Flex R/L 4-/5, Knee Ext R/L 3-/5 Goal status: Ongoing    4.  Patient will be able to ambulate without an AD as evidence of improved right knee healing and function. Baseline: 2ww Goal status: Achieved 6/27      PLAN:   PT FREQUENCY: 1-2x/week   PT DURATION: 10 weeks   PLANNED INTERVENTIONS: Therapeutic exercises, Neuromuscular re-education, Balance training, Gait training, Patient/Family education, Self Care, Joint mobilization, Joint manipulation, Stair training, DME instructions, Aquatic Therapy, Dry Needling, Electrical stimulation, Cryotherapy, Moist heat, Manual therapy, and Re-evaluation   PLAN FOR NEXT SESSION: Review goals for progress note. Progress recumbent bicycle and mini-squat. Long Arc Quad with russian  Ellin Goodie PT, DPT

## 2022-10-15 ENCOUNTER — Encounter: Payer: Medicare PPO | Admitting: Physical Therapy

## 2022-10-16 ENCOUNTER — Ambulatory Visit: Payer: Medicare PPO | Admitting: Physical Therapy

## 2022-10-16 ENCOUNTER — Encounter: Payer: Medicare PPO | Admitting: Physical Therapy

## 2022-10-16 DIAGNOSIS — M25561 Pain in right knee: Secondary | ICD-10-CM | POA: Diagnosis not present

## 2022-10-16 DIAGNOSIS — Z96651 Presence of right artificial knee joint: Secondary | ICD-10-CM

## 2022-10-16 NOTE — Therapy (Unsigned)
OUTPATIENT PHYSICAL THERAPY PROGRESS   Patient Name: Sally Cowan MRN: [id number] DOB:26-Oct-1955, 67 y.o., female Today's Date: 10/16/2022  PCP: Azell Der PA  REFERRING PROVIDER: Azell Der PA   END OF SESSION:   PT End of Session - 10/16/22 1652     Visit Number 10    Number of Visits 20    Date for PT Re-Evaluation 11/19/22    Authorization Type [employer] 2024    Authorization - Number of Visits 20    Progress Note Due on Visit 10    PT Start Time 1650    PT Stop Time 1730    PT Time Calculation (min) 40 min    Equipment Utilized During Treatment Gait belt    Activity Tolerance Patient tolerated treatment well;No increased pain    Behavior During Therapy [facility] for tasks assessed/performed               No past medical history on file. No past surgical history on file. There are no problems to display for this patient.   REFERRING DIAG: Right TKA   THERAPY DIAG:  No diagnosis found.  Rationale for Evaluation and Treatment Rehabilitation  PERTINENT HISTORY: Pt had right tka on June 13th and she reports everything has gone smoothly. She is currently living with her sister in law and she plans on living with her until she is able to drive again.   PRECAUTIONS: None  SUBJECTIVE:                                                                                                                                                                                      SUBJECTIVE STATEMENT: Pt states that she feels a definite improvement in getting in and out of car and she is able to do it a lot easier. Pt reports continuing to have ongoing knee pain that requires her to take pain meds like oxycodone even though she wants to discontinue these medications.   PAIN:  Are you having pain? Yes (more soreness today 2/10) mostly when standing after a long sit    OBJECTIVE:    TODAY'S TREATMENT:  DATE:   10/16/22: All single leg exercises performed on RLE  Matrix recumbent with bike set at 9 and resistance at 3 for 2 min   Knee MMT  Knee Ext R/L 4+/4+ Knee Flex R/L 4+/4+  Knee AROM  Knee Flex R/L 120/130  Knee Ext R/L 3/0   Knee PROM  Knee Flex R/L 125/130 Knee Ext R/L 2/0  FOTO:  Discussion about pain management and need to reach out to orthopedist or PCP about modifying medications for better pain management without ongoing use of narcotics.   10/11/22: All single leg exercises performed on RLE Matrix recumbent with bike set at 9 and resistance at 3 for 2 min  Matrix recumbent with bike set at 9 and resistance at 5 for 3 min  OMEGA Leg Press #55 with seat at 1- 1 x 10  OMEGA Leg Press #75 with seat at 1- 2 x 10  Terminal Knee Ext with Blue Band 1 x 10  -Pt unable to achieve full extension  Terminal Knee Ext with Ball at [facility] with 3 sec hold  2 x 10   Partial Lunges with 1 UE support 1 x 10  -min VC to increase base of support  Partial Lunges with no UE support 2 x 10  OMEGA Knee Ext with #15 3 x 10  -min VC to decrease speed of eccentric portion of exercise     10/08/22: All single leg exercises performed on RLE Matrix recumbent with bike set at 11 and resistance at 2 for 5 min  Step Up on 6 inch with use of BUE support using railings 1 x 10  Step Up on 6 inch with no UE support 2 x 10  TRX Mini-Squats 2 x 10  -Pt reports increased pain in the anterior surface of knee around scar Scar Tissue Massage Education  Right Knee Extensor Lag  Seated Knee Extension with #10 lb weight in backpack  Prone Knee Extension with #10 AW 2 min hang  -min VC to decrease knee extension   10/04/22: All single leg exercises performed on RLE Nu-Step seat and arms 6 with resistance at 2 for 7 min Explanation of EMR and how different EMRs cannot necessarily communicate. Mini-Squats with TRX 1 X 10  Mini-Squats with TRX with butt tap on 18 inch  with foam bad 3 x 10  Mini-Squats with TRX with butt tap on 18 inch chair 3 x 10  Mini-Squats with BUE support on 20 inch mat height 2 x 10  Calf Raises 1 x 15  Calf Raises hold water jug 1 x 15   Discussion about transition of care to new clinic pending move to her home in [facility]     PATIENT EDUCATION:  Education details: form and technique to complete exercises correctly Person educated: Patient Education method: Programmer, multimedia, Facilities manager, Verbal cues, and Handouts Education comprehension: verbalized understanding, returned demonstration, and verbal cues required   HOME EXERCISE PROGRAM: Access Code: [email or web address] URL: [email or web address] Date: 10/11/2022 Prepared by: Ellin Goodie  Exercises - Long Sitting Calf Stretch with Strap  - 1 x daily - 3 reps - 30-60 sec sec  hold - Seated Hamstring Stretch  - 1 x daily - 3 reps - 30-60 sec  hold - Standing Hip Abduction with Counter Support  - 3-4 x weekly - 3 sets - 10 reps - Standing Terminal Knee Extension at Wall with Ball  - 3-4 x weekly - 3 sets - 10 reps - 3 sec hold - Standing  Partial Lunge  - 3-4 x weekly - 3 sets - 10 reps - Mini Squat  - 3-4 x weekly - 3 sets - 10 reps - Prone Knee Extension with Ankle Weight  - 1 x daily - 2-3 reps - 10 min hold - Seated Knee Extension Stretch with Chair  - 1 x daily - 2-3 reps - 5-10 min hold  Patient Education - Scar Massage   ASSESSMENT:   CLINICAL IMPRESSION: Pt is now 6 weeks s/p right TKA for progress note. Pt has met nearly all her rehab goals with an improvement in right knee strength, ROM, and improvement in her perception of right knee function. Despite these improvements, pt reports ongoing right knee pain that she still needs to treat using narcotic pain medications. PT provided education and recommendation that sometimes pain can not improve as much as function and that she should seek out further medical evaluation and treatment to modify pain medications  to avoid dependence. PT to provide additional modalities to help with pain management such as TENS next session. Pt to continue with PT for five more sessions to improve right knee extension. She will continue to benefit from skilled PT to address these aforementioned deficits to return to ambulating and bending and squatting so that she can resume gardening and walking to remain independent and active.   OBJECTIVE IMPAIRMENTS: Abnormal gait, decreased balance, decreased mobility, difficulty walking, decreased ROM, decreased strength, hypomobility, increased edema, impaired flexibility, and pain.    ACTIVITY LIMITATIONS: carrying, lifting, bending, sitting, standing, squatting, sleeping, stairs, bathing, toileting, and dressing   PARTICIPATION LIMITATIONS: cleaning, laundry, driving, shopping, community activity, yard work, and church   PERSONAL FACTORS: Age and Fitness are also affecting patient's functional outcome.    REHAB POTENTIAL: Good   CLINICAL DECISION MAKING: Stable/uncomplicated   EVALUATION COMPLEXITY: Low     GOALS: Goals reviewed with patient? No   SHORT TERM GOALS: Target date: 09/24/2022   Pt will be independent with HEP in order to improve strength and balance in order to decrease fall risk and improve function at home and work. Baseline: Needs verbal cues to perform correctly 10/16/22: Able to perform independently  Goal status: Achieved    2.  Patient will be able to ambulate without a 2WW as evidence of improve RLE function and mobility.  Baseline: 2WW  10/16/22: Walking without an AD  Goal status: Achieved      LONG TERM GOALS: Target date: 11/19/2022   Patient will have improved function and activity level as evidenced by an increase in FOTO score by 10 points or more.  Baseline: 30/100 with target of 55 10/16/22: 57  Goal status: Ongoing    2.  Patient will show near symmetrical right knee AROM to left knee as evidence of improve right knee healing and  function.  Baseline: Knee Flex R/L 60/135, Knee Ext R/L 3/0  10/16/22: Knee Flex R/L 120/130 , Knee Ext R/L 3/0  Goal status: Ongoing    3.  Patient will show near symmetrical right strength as evidence of improved right knee healing and function.  Baseline: Knee Flex R/L 4-/5, Knee Ext R/L 3-/5  10/16/22: Knee Ext R/L 4+/4, Knee Flex R/L 4+/4+ Goal status: Achieved    4.  Patient will be able to ambulate without an AD as evidence of improved right knee healing and function. Baseline: 2ww 09/20/22: Not using AD  Goal status: Achieved 6/27      PLAN:   PT FREQUENCY: 1-2x/week  PT DURATION: 10 weeks   PLANNED INTERVENTIONS: Therapeutic exercises, Neuromuscular re-education, Balance training, Gait training, Patient/Family education, Self Care, Joint mobilization, Joint manipulation, Stair training, DME instructions, Aquatic Therapy, Dry Needling, Electrical stimulation, Cryotherapy, Moist heat, Manual therapy, and Re-evaluation   PLAN FOR NEXT SESSION: TENS to improve movement evoked pain, Continue with knee extension mobility exercises, step up and step down, and side step    Ellin Goodie PT, DPT

## 2022-10-18 ENCOUNTER — Ambulatory Visit: Payer: Medicare PPO | Admitting: Physical Therapy

## 2022-10-23 ENCOUNTER — Ambulatory Visit: Payer: Medicare PPO | Admitting: Physical Therapy

## 2022-10-25 ENCOUNTER — Encounter: Payer: Medicare PPO | Admitting: Physical Therapy

## 2022-10-25 ENCOUNTER — Ambulatory Visit: Payer: Medicare PPO | Attending: Physician Assistant | Admitting: Physical Therapy

## 2022-10-25 ENCOUNTER — Telehealth: Payer: Self-pay | Admitting: Physical Therapy

## 2022-10-25 DIAGNOSIS — Z96651 Presence of right artificial knee joint: Secondary | ICD-10-CM | POA: Insufficient documentation

## 2022-10-25 DIAGNOSIS — M25561 Pain in right knee: Secondary | ICD-10-CM | POA: Insufficient documentation

## 2022-10-25 NOTE — Telephone Encounter (Signed)
Called pt to inquire about absence from PT. Pt had thought she told office to cancel, but her apt had not been taken off the schedule. PT reminded pt of apt for next week.

## 2022-10-30 ENCOUNTER — Encounter: Payer: Self-pay | Admitting: Physical Therapy

## 2022-10-30 ENCOUNTER — Ambulatory Visit: Payer: Medicare PPO | Admitting: Physical Therapy

## 2022-10-30 DIAGNOSIS — Z96651 Presence of right artificial knee joint: Secondary | ICD-10-CM | POA: Diagnosis present

## 2022-10-30 DIAGNOSIS — M25561 Pain in right knee: Secondary | ICD-10-CM

## 2022-10-30 NOTE — Therapy (Signed)
OUTPATIENT PHYSICAL THERAPY PROGRESS   Patient Name: Sally Cowan MRN: [id number] DOB:1956-02-14, 67 y.o., female Today's Date: 10/30/2022  PCP: Azell Der PA  REFERRING PROVIDER: Azell Der PA   END OF SESSION:   PT End of Session - 10/30/22 1348     Visit Number 11    Number of Visits 20    Date for PT Re-Evaluation 11/19/22    Authorization Type [employer] [id number]    Authorization Time Period 09/10/22-11/23/22    Authorization - Visit Number 11    Authorization - Number of Visits 20    Progress Note Due on Visit 10    PT Start Time 1350    PT Stop Time 1430    PT Time Calculation (min) 40 min    Equipment Utilized During Treatment Gait belt    Activity Tolerance Patient tolerated treatment well;No increased pain    Behavior During Therapy [facility] for tasks assessed/performed               History reviewed. No pertinent past medical history. History reviewed. No pertinent surgical history. There are no problems to display for this patient.   REFERRING DIAG: Right TKA   THERAPY DIAG:  Acute pain of right knee  S/P TKR (total knee replacement), right  Rationale for Evaluation and Treatment Rehabilitation  PERTINENT HISTORY: Pt had right tka on June 13th and she reports everything has gone smoothly. She is currently living with her sister in law and she plans on living with her until she is able to drive again.   PRECAUTIONS: None  SUBJECTIVE:                                                                                                                                                                                      SUBJECTIVE STATEMENT: Pt reports that she continues to feel numbness in the anterior surface of right knee. She has an orthopedic apt this upcoming Monday and she was instructed to ask questions then.  PAIN:  Are you having pain? Yes (more soreness today 2-3/10) mostly when standing after a long sit and bending knee to put sock on    OBJECTIVE:     TODAY'S TREATMENT:  DATE:   10/30/22: All single leg exercises performed on RLE  Matrix recumbent with bike set at 9 and resistance at 4 for 5 min  TENS unit education along with adjustment with 4 pads  Step Up-6" step with BUE support 1 x 10  Pt negotiates stairs with report that going downstairs is more painful.  Step Down 6" step with BUE support 1 x 10  Step Down 6" step with BUE support onto yoga block 1 x 10  Side Step Down from 6"step with BUE support 1 x 10  Side Step Down from 6" step onto plank of wood 1 x 10     10/16/22: All single leg exercises performed on RLE  Matrix recumbent with bike set at 9 and resistance at 3 for 2 min   Knee MMT  Knee Ext R/L 4+/4+ Knee Flex R/L 4+/4+  Knee AROM  Knee Flex R/L 120/130  Knee Ext R/L 3/0   Knee PROM  Knee Flex R/L 125/130 Knee Ext R/L 2/0  FOTO:  Discussion about pain management and need to reach out to orthopedist or PCP about modifying medications for better pain management without ongoing use of narcotics.   10/11/22: All single leg exercises performed on RLE Matrix recumbent with bike set at 9 and resistance at 3 for 2 min  Matrix recumbent with bike set at 9 and resistance at 5 for 3 min  OMEGA Leg Press #55 with seat at 1- 1 x 10  OMEGA Leg Press #75 with seat at 1- 2 x 10  Terminal Knee Ext with Blue Band 1 x 10  -Pt unable to achieve full extension  Terminal Knee Ext with Ball at [facility] with 3 sec hold  2 x 10   Partial Lunges with 1 UE support 1 x 10  -min VC to increase base of support  Partial Lunges with no UE support 2 x 10  OMEGA Knee Ext with #15 3 x 10  -min VC to decrease speed of eccentric portion of exercise     10/08/22: All single leg exercises performed on RLE Matrix recumbent with bike set at 11 and resistance at 2 for 5 min  Step Up on 6 inch with use of BUE support  using railings 1 x 10  Step Up on 6 inch with no UE support 2 x 10  TRX Mini-Squats 2 x 10  -Pt reports increased pain in the anterior surface of knee around scar Scar Tissue Massage Education  Right Knee Extensor Lag  Seated Knee Extension with #10 lb weight in backpack  Prone Knee Extension with #10 AW 2 min hang  -min VC to decrease knee extension     PATIENT EDUCATION:  Education details: form and technique to complete exercises correctly Person educated: Patient Education method: Programmer, multimedia, Demonstration, Verbal cues, and Handouts Education comprehension: verbalized understanding, returned demonstration, and verbal cues required   HOME EXERCISE PROGRAM: Access Code: [email or web address] URL: [email or web address] Date: 10/30/2022 Prepared by: Ellin Goodie  Exercises - Long Sitting Calf Stretch with Strap  - 1 x daily - 3 reps - 30-60 sec sec  hold - Seated Hamstring Stretch  - 1 x daily - 3 reps - 30-60 sec  hold - Standing Hip Abduction with Counter Support  - 3-4 x weekly - 3 sets - 10 reps - Mini Squat  - 3-4 x weekly - 3 sets - 10 reps - Prone Knee Extension with Ankle Weight  - 1 x daily - 2-3 reps -  10 min hold - Seated Knee Extension Stretch with Chair  - 1 x daily - 2-3 reps - 5-10 min hold - Lateral Step Down  - 3-4 x weekly - 3 sets - 10 reps - Forward Step Down  - 3-4 x weekly - 3 sets - 10 reps  Patient Education - Scar Massage   ASSESSMENT:   CLINICAL IMPRESSION: Pt is now 8 weeks s/p right TKA for treatment note. Pt shows improvement with right knee strength with ability to perform eccentric quad strengthening exercises with forward and side step downs. She reports TENS helping somewhat with pain modulation and she will use TENS for other exercises in her HEP. She will continue to benefit from skilled PT to address these aforementioned deficits to return to ambulating and bending and squatting so that she can resume gardening and walking to remain  independent and active.  OBJECTIVE IMPAIRMENTS: Abnormal gait, decreased balance, decreased mobility, difficulty walking, decreased ROM, decreased strength, hypomobility, increased edema, impaired flexibility, and pain.    ACTIVITY LIMITATIONS: carrying, lifting, bending, sitting, standing, squatting, sleeping, stairs, bathing, toileting, and dressing   PARTICIPATION LIMITATIONS: cleaning, laundry, driving, shopping, community activity, yard work, and church   PERSONAL FACTORS: Age and Fitness are also affecting patient's functional outcome.    REHAB POTENTIAL: Good   CLINICAL DECISION MAKING: Stable/uncomplicated   EVALUATION COMPLEXITY: Low     GOALS: Goals reviewed with patient? No   SHORT TERM GOALS: Target date: 09/24/2022   Pt will be independent with HEP in order to improve strength and balance in order to decrease fall risk and improve function at home and work. Baseline: Needs verbal cues to perform correctly 10/16/22: Able to perform independently  Goal status: Achieved    2.  Patient will be able to ambulate without a 2WW as evidence of improve RLE function and mobility.  Baseline: 2WW  10/16/22: Walking without an AD  Goal status: Achieved      LONG TERM GOALS: Target date: 11/19/2022   Patient will have improved function and activity level as evidenced by an increase in FOTO score by 10 points or more.  Baseline: 30/100 with target of 55 10/16/22: 57  Goal status: Ongoing    2.  Patient will show near symmetrical right knee AROM to left knee as evidence of improve right knee healing and function.  Baseline: Knee Flex R/L 60/135, Knee Ext R/L 3/0  10/16/22: Knee Flex R/L 120/130 , Knee Ext R/L 3/0  Goal status: Ongoing    3.  Patient will show near symmetrical right strength as evidence of improved right knee healing and function.  Baseline: Knee Flex R/L 4-/5, Knee Ext R/L 3-/5  10/16/22: Knee Ext R/L 4+/4, Knee Flex R/L 4+/4+ Goal status: Achieved    4.  Patient  will be able to ambulate without an AD as evidence of improved right knee healing and function. Baseline: 2ww 09/20/22: Not using AD  Goal status: Achieved 6/27      PLAN:   PT FREQUENCY: 1-2x/week   PT DURATION: 10 weeks   PLANNED INTERVENTIONS: Therapeutic exercises, Neuromuscular re-education, Balance training, Gait training, Patient/Family education, Self Care, Joint mobilization, Joint manipulation, Stair training, DME instructions, Aquatic Therapy, Dry Needling, Electrical stimulation, Cryotherapy, Moist heat, Manual therapy, and Re-evaluation   PLAN FOR NEXT SESSION: TENS to improve movement evoked pain, Continue with knee extension mobility exercises, step up and step down, and side step    Ellin Goodie PT, DPT

## 2022-11-01 ENCOUNTER — Ambulatory Visit: Payer: Medicare PPO | Admitting: Physical Therapy

## 2022-11-01 DIAGNOSIS — Z96651 Presence of right artificial knee joint: Secondary | ICD-10-CM

## 2022-11-01 DIAGNOSIS — M25561 Pain in right knee: Secondary | ICD-10-CM | POA: Diagnosis not present

## 2022-11-01 NOTE — Therapy (Signed)
OUTPATIENT PHYSICAL THERAPY TREATMENT    Patient Name: Sally Cowan MRN: [id number] DOB:1955-04-29, 67 y.o., female Today's Date: 11/01/2022  PCP: Azell Der PA  REFERRING PROVIDER: Azell Der PA   END OF SESSION:   PT End of Session - 11/01/22 1356     Visit Number 12    Number of Visits 20    Date for PT Re-Evaluation 11/19/22    Authorization Type [employer] 2024    Authorization Time Period 09/10/22-11/23/22    Authorization - Visit Number 12    Authorization - Number of Visits 20    Progress Note Due on Visit 10    PT Start Time 1350    PT Stop Time 1430    PT Time Calculation (min) 40 min    Equipment Utilized During Treatment Gait belt    Activity Tolerance Patient tolerated treatment well;No increased pain    Behavior During Therapy [facility] for tasks assessed/performed                No past medical history on file. No past surgical history on file. There are no problems to display for this patient.   REFERRING DIAG: Right TKA   THERAPY DIAG:  Acute pain of right knee  S/P TKR (total knee replacement), right  Rationale for Evaluation and Treatment Rehabilitation  PERTINENT HISTORY: Pt had right tka on June 13th and she reports everything has gone smoothly. She is currently living with her sister in law and she plans on living with her until she is able to drive again.   PRECAUTIONS: None  SUBJECTIVE:                                                                                                                                                                                      SUBJECTIVE STATEMENT: Pt states that she wants to return to jogging and not feeling stiffness when sitting for a prolonged time.   PAIN:  Are you having pain? Yes (more soreness today 2-3/10) mostly when standing after a long sit and bending knee to put sock on    OBJECTIVE:    TODAY'S TREATMENT:  DATE:   11/01/22: All single leg exercises performed on RLE  Matrix Recumbent Bicycle with seat at 5 for 6 min and resistance at 6  Forward Step Downs on 8 inch step on RLE 1 x 5  -Pt unable to perform without UE support  Forward Step Downs on 4 inch step on RLE 2 x 10  OMEGA Knee Extension #10 2 x 10  Gait Analysis: Mild hip drop on left hip  Side Step Down on 6 inch step 2 x 10  Knee Ext on RLE 2 deg AROM   10/30/22: All single leg exercises performed on RLE  Matrix recumbent with bike set at 9 and resistance at 4 for 5 min  TENS unit education along with adjustment with 4 pads  Step Up-6" step with BUE support 1 x 10  Pt negotiates stairs with report that going downstairs is more painful.  Step Down 6" step with BUE support 1 x 10  Step Down 6" step with BUE support onto yoga block 1 x 10  Side Step Down from 6"step with BUE support 1 x 10  Side Step Down from 6" step onto plank of wood 1 x 10     10/16/22: All single leg exercises performed on RLE  Matrix recumbent with bike set at 9 and resistance at 3 for 2 min   Knee MMT  Knee Ext R/L 4+/4+ Knee Flex R/L 4+/4+  Knee AROM  Knee Flex R/L 120/130  Knee Ext R/L 3/0   Knee PROM  Knee Flex R/L 125/130 Knee Ext R/L 2/0  FOTO:  Discussion about pain management and need to reach out to orthopedist or PCP about modifying medications for better pain management without ongoing use of narcotics.   10/11/22: All single leg exercises performed on RLE Matrix recumbent with bike set at 9 and resistance at 3 for 2 min  Matrix recumbent with bike set at 9 and resistance at 5 for 3 min  OMEGA Leg Press #55 with seat at 1- 1 x 10  OMEGA Leg Press #75 with seat at 1- 2 x 10  Terminal Knee Ext with Blue Band 1 x 10  -Pt unable to achieve full extension  Terminal Knee Ext with Ball at Wall with 3 sec hold  2 x 10   Partial Lunges with 1 UE support 1 x 10  -min VC to increase base of support   Partial Lunges with no UE support 2 x 10  OMEGA Knee Ext with #15 3 x 10  -min VC to decrease speed of eccentric portion of exercise    PATIENT EDUCATION:  Education details: form and technique to complete exercises correctly Person educated: Patient Education method: Programmer, multimedia, Demonstration, Verbal cues, and Handouts Education comprehension: verbalized understanding, returned demonstration, and verbal cues required   HOME EXERCISE PROGRAM: Access Code: [email or web address] URL: [email or web address] Date: 10/30/2022 Prepared by: Ellin Goodie  Exercises - Long Sitting Calf Stretch with Strap  - 1 x daily - 3 reps - 30-60 sec sec  hold - Seated Hamstring Stretch  - 1 x daily - 3 reps - 30-60 sec  hold - Standing Hip Abduction with Counter Support  - 3-4 x weekly - 3 sets - 10 reps - Mini Squat  - 3-4 x weekly - 3 sets - 10 reps - Prone Knee Extension with Ankle Weight  - 1 x daily - 2-3 reps - 10 min hold - Seated Knee Extension Stretch with Chair  - 1 x daily -  2-3 reps - 5-10 min hold - Lateral Step Down  - 3-4 x weekly - 3 sets - 10 reps - Forward Step Down  - 3-4 x weekly - 3 sets - 10 reps  Patient Education - Scar Massage   ASSESSMENT:   CLINICAL IMPRESSION: Pt is now 9 weeks s/p right TKA. She continues to progress with left knee strength with ability to perform step down with decreased UE support. Despite desire to return to running, pt still lacks adequate quad control with inability to perform forward step down heel touches without UE support. She will continue to benefit from skilled PT to address these aforementioned deficits to return to ambulating and bending and squatting so that she can resume gardening and walking to remain independent and active.  OBJECTIVE IMPAIRMENTS: Abnormal gait, decreased balance, decreased mobility, difficulty walking, decreased ROM, decreased strength, hypomobility, increased edema, impaired flexibility, and pain.    ACTIVITY  LIMITATIONS: carrying, lifting, bending, sitting, standing, squatting, sleeping, stairs, bathing, toileting, and dressing   PARTICIPATION LIMITATIONS: cleaning, laundry, driving, shopping, community activity, yard work, and church   PERSONAL FACTORS: Age and Fitness are also affecting patient's functional outcome.    REHAB POTENTIAL: Good   CLINICAL DECISION MAKING: Stable/uncomplicated   EVALUATION COMPLEXITY: Low     GOALS: Goals reviewed with patient? No   SHORT TERM GOALS: Target date: 09/24/2022   Pt will be independent with HEP in order to improve strength and balance in order to decrease fall risk and improve function at home and work. Baseline: Needs verbal cues to perform correctly 10/16/22: Able to perform independently  Goal status: Achieved    2.  Patient will be able to ambulate without a 2WW as evidence of improve RLE function and mobility.  Baseline: 2WW  10/16/22: Walking without an AD  Goal status: Achieved      LONG TERM GOALS: Target date: 11/19/2022   Patient will have improved function and activity level as evidenced by an increase in FOTO score by 10 points or more.  Baseline: 30/100 with target of 55 10/16/22: 57  Goal status: Ongoing    2.  Patient will show near symmetrical right knee AROM to left knee as evidence of improve right knee healing and function.  Baseline: Knee Flex R/L 60/135, Knee Ext R/L 3/0  10/16/22: Knee Flex R/L 120/130 , Knee Ext R/L 3/0  Goal status: Ongoing    3.  Patient will show near symmetrical right strength as evidence of improved right knee healing and function.  Baseline: Knee Flex R/L 4-/5, Knee Ext R/L 3-/5  10/16/22: Knee Ext R/L 4+/4, Knee Flex R/L 4+/4+ Goal status: Achieved    4.  Patient will be able to ambulate without an AD as evidence of improved right knee healing and function. Baseline: 2ww 09/20/22: Not using AD  Goal status: Achieved 6/27      PLAN:   PT FREQUENCY: 1-2x/week   PT DURATION: 10 weeks    PLANNED INTERVENTIONS: Therapeutic exercises, Neuromuscular re-education, Balance training, Gait training, Patient/Family education, Self Care, Joint mobilization, Joint manipulation, Stair training, DME instructions, Aquatic Therapy, Dry Needling, Electrical stimulation, Cryotherapy, Moist heat, Manual therapy, and Re-evaluation   PLAN FOR NEXT SESSION: TM warm up and continue with progression of knee extension machine and attempt single leg sit to stand.    Ellin Goodie PT, DPT

## 2022-11-06 ENCOUNTER — Ambulatory Visit: Payer: Medicare PPO | Admitting: Physical Therapy

## 2022-11-08 ENCOUNTER — Ambulatory Visit: Payer: Medicare PPO | Admitting: Physical Therapy

## 2022-11-08 DIAGNOSIS — Z96651 Presence of right artificial knee joint: Secondary | ICD-10-CM

## 2022-11-08 DIAGNOSIS — M25561 Pain in right knee: Secondary | ICD-10-CM

## 2022-11-08 NOTE — Therapy (Signed)
OUTPATIENT PHYSICAL THERAPY DISCHARGE     Patient Name: Sally Cowan MRN: [id number] DOB:09-12-1955, 67 y.o., female Today's Date: 11/08/2022  PCP: Azell Der PA  REFERRING PROVIDER: Azell Der PA   END OF SESSION:   PT End of Session - 11/08/22 1603     Visit Number 13    Number of Visits 20    Date for PT Re-Evaluation 11/19/22    Authorization Type [employer] 2024    Authorization Time Period 09/10/22-11/23/22    Authorization - Visit Number 13    Authorization - Number of Visits 20    Progress Note Due on Visit 10    PT Start Time 1515    PT Stop Time 1600    PT Time Calculation (min) 45 min    Equipment Utilized During Treatment Gait belt    Activity Tolerance Patient tolerated treatment well;No increased pain    Behavior During Therapy [facility] for tasks assessed/performed                 No past medical history on file. No past surgical history on file. There are no problems to display for this patient.   REFERRING DIAG: Right TKA   THERAPY DIAG:  Acute pain of right knee  S/P TKR (total knee replacement), right  Rationale for Evaluation and Treatment Rehabilitation  PERTINENT HISTORY: Pt had right tka on June 13th and she reports everything has gone smoothly. She is currently living with her sister in law and she plans on living with her until she is able to drive again.   PRECAUTIONS: None  SUBJECTIVE:                                                                                                                                                                                      SUBJECTIVE STATEMENT: Pt reports decreased pain in the knee but she still notices ROM restrictions. Pt states she still having clicking in knee and orthopedist thinks it is related to IT band issues.   PAIN:  Are you having pain? No    OBJECTIVE:    TODAY'S TREATMENT:  DATE:   11/08/22: All single leg exercises performed on RLE  Matrix recumbent bike with level 4 resistance with seat at 13  R Knee Ext A/PROM 3 deg  R Knee Flex AROM 120 deg  Palpation: Lateral femoral condyle clicking with knee flexion and extension  FOTO: 63  Knee AP Glides Grade III-IV  -hypomobile    11/01/22: All single leg exercises performed on RLE  Matrix Recumbent Bicycle with seat at 5 for 6 min and resistance at 6  Forward Step Downs on 8 inch step on RLE 1 x 5  -Pt unable to perform without UE support  Forward Step Downs on 4 inch step on RLE 2 x 10  OMEGA Knee Extension #10 2 x 10  Gait Analysis: Mild hip drop on left hip  Side Step Down on 6 inch step 2 x 10  Knee Ext on RLE 2 deg AROM   10/30/22: All single leg exercises performed on RLE  Matrix recumbent with bike set at 9 and resistance at 4 for 5 min  TENS unit education along with adjustment with 4 pads  Step Up-6" step with BUE support 1 x 10  Pt negotiates stairs with report that going downstairs is more painful.  Step Down 6" step with BUE support 1 x 10  Step Down 6" step with BUE support onto yoga block 1 x 10  Side Step Down from 6"step with BUE support 1 x 10  Side Step Down from 6" step onto plank of wood 1 x 10     10/16/22: All single leg exercises performed on RLE  Matrix recumbent with bike set at 9 and resistance at 3 for 2 min   Knee MMT  Knee Ext R/L 4+/4+ Knee Flex R/L 4+/4+  Knee AROM  Knee Flex R/L 120/130  Knee Ext R/L 3/0   Knee PROM  Knee Flex R/L 125/130 Knee Ext R/L 2/0  FOTO:  Discussion about pain management and need to reach out to orthopedist or PCP about modifying medications for better pain management without ongoing use of narcotics.   10/11/22: All single leg exercises performed on RLE Matrix recumbent with bike set at 9 and resistance at 3 for 2 min  Matrix recumbent with bike set at 9 and resistance at 5 for 3 min  OMEGA Leg Press #55 with seat at  1- 1 x 10  OMEGA Leg Press #75 with seat at 1- 2 x 10  Terminal Knee Ext with Blue Band 1 x 10  -Pt unable to achieve full extension  Terminal Knee Ext with Ball at Wall with 3 sec hold  2 x 10   Partial Lunges with 1 UE support 1 x 10  -min VC to increase base of support  Partial Lunges with no UE support 2 x 10  OMEGA Knee Ext with #15 3 x 10  -min VC to decrease speed of eccentric portion of exercise    PATIENT EDUCATION:  Education details: form and technique to complete exercises correctly Person educated: Patient Education method: Programmer, multimedia, Demonstration, Verbal cues, and Handouts Education comprehension: verbalized understanding, returned demonstration, and verbal cues required   HOME EXERCISE PROGRAM: Access Code: [email or web address] URL: [email or web address] Date: 10/30/2022 Prepared by: Ellin Goodie  Exercises - Long Sitting Calf Stretch with Strap  - 1 x daily - 3 reps - 30-60 sec sec  hold - Seated Hamstring Stretch  - 1 x daily - 3 reps - 30-60 sec  hold - Standing Hip Abduction with  Counter Support  - 3-4 x weekly - 3 sets - 10 reps - Mini Squat  - 3-4 x weekly - 3 sets - 10 reps - Prone Knee Extension with Ankle Weight  - 1 x daily - 2-3 reps - 10 min hold - Seated Knee Extension Stretch with Chair  - 1 x daily - 2-3 reps - 5-10 min hold - Lateral Step Down  - 3-4 x weekly - 3 sets - 10 reps - Forward Step Down  - 3-4 x weekly - 3 sets - 10 reps  Patient Education - Scar Massage   ASSESSMENT:   CLINICAL IMPRESSION: Pt is now 10 weeks s/p right TKA. Pt needs to transition her physical therapy care to outpatient clinic closer to her home in [place], [place]. She is close to meeting all her rehab goals, but she still is lacking full right knee extension ROM. Palpable clicking in lateral joint line of right knee felt with pt flexing and extending knee. This non-painful but worrisome to pt. X-ray not available to see joint space and possible  explanation for clicking. She will continue to benefit from skilled PT at her next outpatient clinic to improve right knee extension and to better normalize gait.    OBJECTIVE IMPAIRMENTS: Abnormal gait, decreased balance, decreased mobility, difficulty walking, decreased ROM, decreased strength, hypomobility, increased edema, impaired flexibility, and pain.    ACTIVITY LIMITATIONS: carrying, lifting, bending, sitting, standing, squatting, sleeping, stairs, bathing, toileting, and dressing   PARTICIPATION LIMITATIONS: cleaning, laundry, driving, shopping, community activity, yard work, and church   PERSONAL FACTORS: Age and Fitness are also affecting patient's functional outcome.    REHAB POTENTIAL: Good   CLINICAL DECISION MAKING: Stable/uncomplicated   EVALUATION COMPLEXITY: Low     GOALS: Goals reviewed with patient? No   SHORT TERM GOALS: Target date: 09/24/2022   Pt will be independent with HEP in order to improve strength and balance in order to decrease fall risk and improve function at home and work. Baseline: Needs verbal cues to perform correctly 10/16/22: Able to perform independently  Goal status: Achieved    2.  Patient will be able to ambulate without a 2WW as evidence of improve RLE function and mobility.  Baseline: 2WW  10/16/22: Walking without an AD  Goal status: Achieved      LONG TERM GOALS: Target date: 11/19/2022   Patient will have improved function and activity level as evidenced by an increase in FOTO score by 10 points or more.  Baseline: 30/100 with target of 55 10/16/22: 57 11/08/22: 63 Goal status: ACHIEVED    2.  Patient will show near symmetrical right knee AROM to left knee as evidence of improve right knee healing and function.  Baseline: Knee Flex R/L 60/135, Knee Ext R/L 3/0  10/16/22: Knee Flex R/L 120/130 , Knee Ext R/L 3/0 11/08/22: Knee Ext AROM R 3, Knee Flex AROM R 120   Goal status: NOT MET    3.  Patient will show near symmetrical right  strength as evidence of improved right knee healing and function.  Baseline: Knee Flex R/L 4-/5, Knee Ext R/L 3-/5  10/16/22: Knee Ext R/L 4+/4, Knee Flex R/L 4+/4+ Goal status: Achieved    4.  Patient will be able to ambulate without an AD as evidence of improved right knee healing and function. Baseline: 2ww 09/20/22: Not using AD  Goal status: Achieved      PLAN:   PT FREQUENCY: 1-2x/week   PT DURATION: 10 weeks  PLANNED INTERVENTIONS: Therapeutic exercises, Neuromuscular re-education, Balance training, Gait training, Patient/Family education, Self Care, Joint mobilization, Joint manipulation, Stair training, DME instructions, Aquatic Therapy, Dry Needling, Electrical stimulation, Cryotherapy, Moist heat, Manual therapy, and Re-evaluation   PLAN FOR NEXT SESSION: Discharge from PT   Ellin Goodie PT, DPT  [facility] [address], [place], [address] Phone: [phone]   Fax:  [phone]

## 2022-11-13 ENCOUNTER — Ambulatory Visit: Payer: Medicare PPO | Admitting: Physical Therapy

## 2024-01-01 ENCOUNTER — Encounter: Payer: Self-pay | Admitting: Gastroenterology

## 2024-01-02 ENCOUNTER — Ambulatory Visit
Admission: RE | Admit: 2024-01-02 | Discharge: 2024-01-02 | Disposition: A | Discharge status: Home / Self Care | Attending: Gastroenterology | Admitting: Gastroenterology

## 2024-01-02 ENCOUNTER — Ambulatory Visit: Admitting: Anesthesiology

## 2024-01-02 ENCOUNTER — Encounter: Admission: RE | Disposition: A | Payer: Self-pay | Source: Home / Self Care | Attending: Gastroenterology

## 2024-01-02 ENCOUNTER — Encounter: Payer: Self-pay | Admitting: Gastroenterology

## 2024-01-02 DIAGNOSIS — Z83719 Family history of colon polyps, unspecified: Secondary | ICD-10-CM | POA: Diagnosis not present

## 2024-01-02 DIAGNOSIS — K5730 Diverticulosis of large intestine without perforation or abscess without bleeding: Secondary | ICD-10-CM | POA: Diagnosis not present

## 2024-01-02 DIAGNOSIS — Z1211 Encounter for screening for malignant neoplasm of colon: Secondary | ICD-10-CM | POA: Insufficient documentation

## 2024-01-02 HISTORY — DX: Tubal ligation status: Z98.51

## 2024-01-02 HISTORY — PX: COLONOSCOPY: SHX5424

## 2024-01-02 HISTORY — DX: Unspecified hemorrhoids: K64.9

## 2024-01-02 HISTORY — DX: Depression, unspecified: F32.A

## 2024-01-02 HISTORY — DX: Anxiety disorder, unspecified: F41.9

## 2024-01-02 SURGERY — COLONOSCOPY
Anesthesia: General

## 2024-01-02 MED ORDER — LIDOCAINE HCL (PF) 2 % IJ SOLN
INTRAMUSCULAR | Status: AC
  Filled 2024-01-02: qty 5

## 2024-01-02 MED ORDER — PROPOFOL 500 MG/50ML IV EMUL
INTRAVENOUS | Status: DC | PRN
  Administered 2024-01-02: 75 ug/kg/min via INTRAVENOUS

## 2024-01-02 MED ORDER — PROPOFOL 1000 MG/100ML IV EMUL
INTRAVENOUS | Status: AC
  Filled 2024-01-02: qty 100

## 2024-01-02 MED ORDER — PROPOFOL 10 MG/ML IV BOLUS
INTRAVENOUS | Status: DC | PRN
  Administered 2024-01-02 (×2): 40 mg via INTRAVENOUS

## 2024-01-02 MED ORDER — EPHEDRINE 5 MG/ML INJ
INTRAVENOUS | Status: AC
  Filled 2024-01-02: qty 5

## 2024-01-02 MED ORDER — EPHEDRINE SULFATE-NACL 50-0.9 MG/10ML-% IV SOSY
PREFILLED_SYRINGE | INTRAVENOUS | Status: DC | PRN
  Administered 2024-01-02: 10 mg via INTRAVENOUS
  Administered 2024-01-02 (×2): 15 mg via INTRAVENOUS
  Administered 2024-01-02: 10 mg via INTRAVENOUS

## 2024-01-02 MED ORDER — SODIUM CHLORIDE 0.9 % IV SOLN: INTRAVENOUS | Status: DC

## 2024-01-02 MED ORDER — LIDOCAINE HCL (CARDIAC) PF 100 MG/5ML IV SOSY
PREFILLED_SYRINGE | INTRAVENOUS | Status: DC | PRN
  Administered 2024-01-02: 60 mg via INTRAVENOUS

## 2024-01-02 MED ORDER — DEXMEDETOMIDINE HCL IN NACL 80 MCG/20ML IV SOLN
INTRAVENOUS | Status: DC | PRN
  Administered 2024-01-02: 8 ug via INTRAVENOUS
  Administered 2024-01-02: 12 ug via INTRAVENOUS

## 2024-01-02 NOTE — H&P (Signed)
 Corinn JONELLE Brooklyn, MD [facility], DHIP [address]  [place], [place] [address]  Main: [phone] Fax:  [phone] Pager: [phone]   Primary Care Physician:  Zenchenko, Yevgeniy, MD Primary Gastroenterologist:  Dr. Corinn JONELLE Brooklyn  Pre-Procedure History & Physical: HPI:  Sally Cowan is a 68 y.o. female is here for an colonoscopy.   Past Medical History:  Diagnosis Date   Anxiety    Depression    H/O tubal ligation    Hemorrhoid     Past Surgical History:  Procedure Laterality Date   JOINT REPLACEMENT Right 2024   knee    Prior to Admission medications   Medication Sig Start Date End Date Taking? Authorizing Provider  ALPRAZolam (NIRAVAM) 0.25 MG dissolvable tablet Take 0.25 mg by mouth at bedtime as needed for anxiety.   Yes [provider]  buPROPion (ZYBAN) 150 MG 12 hr tablet Take 150 mg by mouth 2 (two) times daily.   Yes [provider]  meloxicam (MOBIC) 15 MG tablet Take 15 mg by mouth daily.   Yes [provider]  mupirocin cream (BACTROBAN) 2 % Apply 1 Application topically 2 (two) times daily.   Yes [provider]  naproxen sodium (ALEVE) 220 MG tablet Take 220 mg by mouth daily as needed.   Yes [provider]  sertraline (ZOLOFT) 100 MG tablet Take 100 mg by mouth daily.   Yes [provider]  triamcinolone (KENALOG) 0.025 % cream Apply 1 Application topically 2 (two) times daily.   Yes [provider]  venlafaxine XR (EFFEXOR-XR) 150 MG 24 hr capsule Take 150 mg by mouth daily with breakfast.   Yes [provider]    Allergies as of 12/27/2023   (Not on File)    Family History  Problem Relation Age of Onset   Breast cancer Neg Hx     Social History   Socioeconomic History   Marital status: Married    Spouse name: Not on file   Number of children: Not on file   Years of education: Not on file   Highest education level: Not on file   Occupational History   Not on file  Tobacco Use   Smoking status: Never   Smokeless tobacco: Never  Substance and Sexual Activity   Alcohol use: Never   Drug use: Never   Sexual activity: Not on file  Other Topics Concern   Not on file  Social History Narrative   Not on file   Social Drivers of Health   Financial Resource Strain: Not on file  Food Insecurity: Not on file  Transportation Needs: Not on file  Physical Activity: Not on file  Stress: Not on file  Social Connections: Not on file  Intimate Partner Violence: Not on file    Review of Systems: See HPI, otherwise negative ROS  Physical Exam: BP 120/71   Pulse (!) 56   Temp (!) 96.8 F (36 C) (Temporal)   Resp 18   Ht 5' 4 (1.626 m)   Wt 71.8 kg   SpO2 99%   BMI 27.19 kg/m  General:   Alert,  pleasant and cooperative in NAD Head:  Normocephalic and atraumatic. Neck:  Supple; no masses or thyromegaly. Lungs:  Clear throughout to auscultation.    Heart:  Regular rate and rhythm. Abdomen:  Soft, nontender and nondistended. Normal bowel sounds, without guarding, and without rebound.   Neurologic:  Alert and  oriented x4;  grossly normal neurologically.  Impression/Plan: Sally Cowan is here for an colonoscopy to be performed for colon cancer screening  Risks, benefits, limitations, and alternatives regarding  colonoscopy have been reviewed with the patient.  Questions have been answered.  All parties agreeable.   Corinn Brooklyn, MD  01/02/2024, 8:01 AM

## 2024-01-02 NOTE — Anesthesia Postprocedure Evaluation (Signed)
 Anesthesia Post Note  Patient: Sally Cowan  Procedure(s) Performed: COLONOSCOPY  Patient location during evaluation: PACU Anesthesia Type: General Level of consciousness: awake and alert, oriented and patient cooperative Pain management: pain level controlled Vital Signs Assessment: post-procedure vital signs reviewed and stable Respiratory status: spontaneous breathing, nonlabored ventilation and respiratory function stable Cardiovascular status: blood pressure returned to baseline and stable Postop Assessment: adequate PO intake Anesthetic complications: no   No notable events documented.   Last Vitals:  Vitals:   01/02/24 0834 01/02/24 0843  BP: 107/75 (!) 108/51  Pulse: 83   Resp: 18   Temp:    SpO2: 97%     Last Pain:  Vitals:   01/02/24 0843  TempSrc:   PainSc: 0-No pain                 Alfonso Ruths

## 2024-01-02 NOTE — Anesthesia Preprocedure Evaluation (Addendum)
 Anesthesia Evaluation  Patient identified by MRN, date of birth, ID band Patient awake    Reviewed: Allergy & Precautions, NPO status , Patient's Chart, lab work & pertinent test results  History of Anesthesia Complications Negative for: history of anesthetic complications  Airway Mallampati: IV   Neck ROM: Full    Dental no notable dental hx.    Pulmonary neg pulmonary ROS   Pulmonary exam normal breath sounds clear to auscultation       Cardiovascular Exercise Tolerance: Good negative cardio ROS Normal cardiovascular exam Rhythm:Regular Rate:Normal     Neuro/Psych  PSYCHIATRIC DISORDERS Anxiety Depression    negative neurological ROS     GI/Hepatic negative GI ROS,,,  Endo/Other  negative endocrine ROS    Renal/GU negative Renal ROS     Musculoskeletal  (+) Arthritis ,    Abdominal   Peds  Hematology negative hematology ROS (+)   Anesthesia Other Findings   Reproductive/Obstetrics                              Anesthesia Physical Anesthesia Plan  ASA: 2  Anesthesia Plan: General   Post-op Pain Management:    Induction: Intravenous  PONV Risk Score and Plan: 3 and Propofol infusion, TIVA and Treatment may vary due to age or medical condition  Airway Management Planned: Natural Airway  Additional Equipment:   Intra-op Plan:   Post-operative Plan:   Informed Consent: I have reviewed the patients History and Physical, chart, labs and discussed the procedure including the risks, benefits and alternatives for the proposed anesthesia with the patient or authorized representative who has indicated his/her understanding and acceptance.       Plan Discussed with: CRNA  Anesthesia Plan Comments: (LMA/GETA backup discussed.  Patient consented for risks of anesthesia including but not limited to:  - adverse reactions to medications - damage to eyes, teeth, lips or other oral  mucosa - nerve damage due to positioning  - sore throat or hoarseness - damage to heart, brain, nerves, lungs, other parts of body or loss of life  Informed patient about role of CRNA in peri- and intra-operative care.  Patient voiced understanding.)         Anesthesia Quick Evaluation

## 2024-01-02 NOTE — Transfer of Care (Signed)
 Immediate Anesthesia Transfer of Care Note  Patient: Sally Cowan  Procedure(s) Performed: COLONOSCOPY  Patient Location: PACU  Anesthesia Type:General  Level of Consciousness: sedated  Airway & Oxygen Therapy: Patient Spontanous Breathing  Post-op Assessment: Report given to RN and Post -op Vital signs reviewed and stable  Post vital signs: Reviewed and stable  Last Vitals:  Vitals Value Taken Time  BP 103/50 01/02/24 08:26  Temp 35.9 C 01/02/24 08:24  Pulse 72 01/02/24 08:26  Resp 15 01/02/24 08:26  SpO2 98 % 01/02/24 08:26  Vitals shown include unfiled device data.  Last Pain:  Vitals:   01/02/24 0824  TempSrc: Temporal  PainSc: 0-No pain         Complications: No notable events documented.

## 2024-01-02 NOTE — Op Note (Addendum)
 [facility] Patient Name: Sally Cowan Procedure Date: 01/02/2024 6:57 AM MRN: [id number] Account #: [id number] Date of Birth: 1955-12-09 Admit Type: Outpatient Age: 68 Room: [facility] ENDO ROOM 3 Gender: Female Note Status: Supervisor Override Instrument Name: Colon Scope [id number] Procedure:             Colonoscopy Indications:           Screening for colorectal malignant neoplasm, Colon                         cancer screening in patient at increased risk: Family                         history of 1st-degree relative with colon polyps, Last                         colonoscopy: June 2007 Providers:             Corinn Jess Brooklyn MD, MD Referring MD:          Zenchenko, yevgen Medicines:             General Anesthesia Complications:         No immediate complications. Estimated blood loss: None. Procedure:             Pre-Anesthesia Assessment:                        - Prior to the procedure, a History and Physical was                         performed, and patient medications and allergies were                         reviewed. The patient is competent. The risks and                         benefits of the procedure and the sedation options and                         risks were discussed with the patient. All questions                         were answered and informed consent was obtained.                         Patient identification and proposed procedure were                         verified by the physician, the nurse, the                         anesthesiologist, the anesthetist and the technician                         in the pre-procedure area in the procedure room in the                         endoscopy suite. Mental Status Examination: alert and  oriented. Airway Examination: normal oropharyngeal                         airway and neck mobility. Respiratory Examination:                         clear to auscultation.  CV Examination: normal.                         Prophylactic Antibiotics: The patient does not require                         prophylactic antibiotics. Prior Anticoagulants: The                         patient has taken no anticoagulant or antiplatelet                         agents. ASA Grade Assessment: II - A patient with mild                         systemic disease. After reviewing the risks and                         benefits, the patient was deemed in satisfactory                         condition to undergo the procedure. The anesthesia                         plan was to use general anesthesia. Immediately prior                         to administration of medications, the patient was                         re-assessed for adequacy to receive sedatives. The                         heart rate, respiratory rate, oxygen saturations,                         blood pressure, adequacy of pulmonary ventilation, and                         response to care were monitored throughout the                         procedure. The physical status of the patient was                         re-assessed after the procedure.                        After obtaining informed consent, the colonoscope was                         passed under direct vision. Throughout the procedure,  the patient's blood pressure, pulse, and oxygen                         saturations were monitored continuously. The                         Colonoscope was introduced through the anus and                         advanced to the the cecum, identified by appendiceal                         orifice and ileocecal valve. The colonoscopy was                         performed without difficulty. The patient tolerated                         the procedure well. The quality of the bowel                         preparation was evaluated using the BBPS [facility] Bowel                         Preparation Scale)  with scores of: Right Colon = 3,                         Transverse Colon = 3 and Left Colon = 3 (entire mucosa                         seen well with no residual staining, small fragments                         of stool or opaque liquid). The total BBPS score                         equals 9. The ileocecal valve, appendiceal orifice,                         and rectum were photographed. Findings:      The perianal and digital rectal examinations were normal. Pertinent       negatives include normal sphincter tone and no palpable rectal lesions.      Multiple small-mouthed diverticula were found in the recto-sigmoid colon       and sigmoid colon.      The retroflexed view of the distal rectum and anal verge was normal and       showed no anal or rectal abnormalities.      The exam was otherwise without abnormality. Impression:            - Diverticulosis in the recto-sigmoid colon and in the                         sigmoid colon.                        - The distal rectum and anal verge are normal on  retroflexion view.                        - The examination was otherwise normal.                        - No specimens collected. Recommendation:        - Discharge patient to home (with escort).                        - Resume previous diet today.                        - Continue present medications.                        - Repeat colonoscopy in 10 years for screening                         purposes. Procedure Code(s):     --- Professional ---                        H9894, Colorectal cancer screening; colonoscopy on                         individual at high risk Diagnosis Code(s):     --- Professional ---                        Z12.11, Encounter for screening for malignant neoplasm                         of colon                        K57.30, Diverticulosis of large intestine without                         perforation or abscess without bleeding                         Z83.71, Family history of colonic polyps CPT copyright 2022 American Medical Association. All rights reserved. The codes documented in this report are preliminary and upon coder review may  be revised to meet current compliance requirements. Dr. Corinn Brooklyn Corinn Jess Brooklyn MD, MD 01/02/2024 8:22:48 AM This report has been signed electronically. Number of Addenda: 0 Note Initiated On: 01/02/2024 6:57 AM Scope Withdrawal Time: 0 hours 5 minutes 50 seconds  Total Procedure Duration: 0 hours 9 minutes 31 seconds  Estimated Blood Loss:  Estimated blood loss: none.      [facility]

## 2024-01-03 ENCOUNTER — Encounter: Payer: Self-pay | Admitting: Gastroenterology
